# Patient Record
Sex: Male | Born: 1995 | Race: White | Hispanic: No | Marital: Single | Smoking: Never smoker
Health system: Southern US, Community
[De-identification: ages and names within clinical notes are randomized; demographics above are authoritative.]

## PROBLEM LIST (undated history)

## (undated) DIAGNOSIS — J45909 Unspecified asthma, uncomplicated: Secondary | ICD-10-CM

---
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
## 2017-03-29 ENCOUNTER — Emergency Department: Payer: Self-pay

## 2017-03-29 ENCOUNTER — Encounter: Payer: Self-pay | Admitting: Emergency Medicine

## 2017-03-29 ENCOUNTER — Emergency Department
Admission: EM | Admit: 2017-03-29 | Discharge: 2017-03-29 | Disposition: A | Discharge status: Home / Self Care | Payer: Self-pay | Attending: Emergency Medicine | Admitting: Emergency Medicine

## 2017-03-29 DIAGNOSIS — X509XXA Other and unspecified overexertion or strenuous movements or postures, initial encounter: Secondary | ICD-10-CM | POA: Insufficient documentation

## 2017-03-29 DIAGNOSIS — S92154A Nondisplaced avulsion fracture (chip fracture) of right talus, initial encounter for closed fracture: Secondary | ICD-10-CM | POA: Insufficient documentation

## 2017-03-29 DIAGNOSIS — T148XXA Other injury of unspecified body region, initial encounter: Secondary | ICD-10-CM

## 2017-03-29 DIAGNOSIS — S93401A Sprain of unspecified ligament of right ankle, initial encounter: Secondary | ICD-10-CM | POA: Insufficient documentation

## 2017-03-29 DIAGNOSIS — Y929 Unspecified place or not applicable: Secondary | ICD-10-CM | POA: Insufficient documentation

## 2017-03-29 DIAGNOSIS — Y999 Unspecified external cause status: Secondary | ICD-10-CM | POA: Insufficient documentation

## 2017-03-29 DIAGNOSIS — Y9341 Activity, dancing: Secondary | ICD-10-CM | POA: Insufficient documentation

## 2017-03-29 NOTE — ED Provider Notes (Signed)
[facility] Emergency Department Provider Note  ____________________________________________  Time seen: Approximately 12:14 PM  I have reviewed the triage vital signs and the nursing notes.   HISTORY  Chief Complaint Ankle Pain    HPI Greg Nichols is a 21 y.o. male that presents to the emergency department with right ankle pain for one day. Patient states that he was dancing last night when he rolled his right ankle. He states that his shoes are "really soft "so he did not realize that he was on the side of his foot rather than on the bottom. He did not hit the ground because his friend saw him fall and caught him. He did not hit his head or lose consciousness. He continued to walk on his leg after incident. Pain and swelling started on the outside of his ankle. No alleviating measures have been attempted. He denies shortness of breath, chest pain, nausea, vomiting, abdominal pain.   History reviewed. No pertinent past medical history.  There are no active problems to display for this patient.   History reviewed. No pertinent surgical history.  Prior to Admission medications   Not on File    Allergies Codeine  No family history on file.  Social History Social History  Substance Use Topics  . Smoking status: Never Smoker  . Smokeless tobacco: Never Used  . Alcohol use No     Review of Systems  Constitutional: No fever/chills Cardiovascular: No chest pain. Respiratory: No SOB. Gastrointestinal: No abdominal pain.  No nausea, no vomiting.  Musculoskeletal: Positive for ankle pain. Skin: Negative for rash, abrasions, lacerations. Positive for ecchymosis. Neurological: Negative for headaches, numbness or tingling   ____________________________________________   PHYSICAL EXAM:  VITAL SIGNS: ED Triage Vitals  Enc Vitals Group     BP 03/29/17 1151 139/77     Pulse Rate 03/29/17 1151 87     Resp 03/29/17 1151 20     Temp 03/29/17 1151  98.1 F (36.7 C)     Temp src --      SpO2 03/29/17 1151 97 %     Weight 03/29/17 1108 250 lb (113.4 kg)     Height 03/29/17 1108 6' (1.829 m)     Head Circumference --      Peak Flow --      Pain Score --      Pain Loc --      Pain Edu? --      Excl. in GC? --      Constitutional: Alert and oriented. Well appearing and in no acute distress. Eyes: Conjunctivae are normal. PERRL. EOMI. Head: Atraumatic. ENT:      Ears:      Nose: No congestion/rhinnorhea.      Mouth/Throat: Mucous membranes are moist.  Neck: No stridor. Cardiovascular: Normal rate, regular rhythm.  Good peripheral circulation. 2+ dorsalis pedis pulses. Respiratory: Normal respiratory effort without tachypnea or retractions. Lungs CTAB. Good air entry to the bases with no decreased or absent breath sounds. Gastrointestinal: Bowel sounds 4 quadrants. Soft and nontender to palpation. No guarding or rigidity. No palpable masses. No distention.  Musculoskeletal: Full range of motion to all extremities. No gross deformities appreciated.  Swelling and ecchymosis to lateral malleolus. Neurologic:  Normal speech and language. No gross focal neurologic deficits are appreciated.  Skin:  Skin is warm, dry and intact. No rash noted.   ____________________________________________   LABS (all labs ordered are listed, but only abnormal results are displayed)  Labs Reviewed -  No data to display ____________________________________________  EKG   ____________________________________________  RADIOLOGY Lexine BatonI, Raiden Haydu, personally viewed and evaluated these images (plain radiographs) as part of my medical decision making, as well as reviewing the written report by the radiologist.  Dg Ankle Complete Right  Result Date: 03/29/2017 CLINICAL DATA:  Twisted ankle.  Pain swelling . EXAM: RIGHT ANKLE - COMPLETE 3+ VIEW COMPARISON:  No recent prior. FINDINGS: Diffuse soft tissue swelling is present. Tiny bony density noted  arising from the lateral aspect of the talus consistent with tiny avulsion fracture. IMPRESSION: Tiny bony density noted arising from the lateral aspect the talus consistent tiny avulsion fracture. Electronically Signed   By: Maisie Fushomas  Register   On: 03/29/2017 12:45    ____________________________________________    PROCEDURES  Procedure(s) performed:    Procedures    Medications - No data to display   ____________________________________________   INITIAL IMPRESSION / ASSESSMENT AND PLAN / ED COURSE  Pertinent labs & imaging results that were available during my care of the patient were reviewed by me and considered in my medical decision making (see chart for details).  Review of the [place] CSRS was performed in accordance of the [facility] prior to dispensing any controlled drugs.   Patient's diagnosis is consistent with avulsion fracture. Vital signs and exam are reassuring. X-ray consistent with avulsion fracture. Cadillac splint was applied. Crutches were given. Patient does not want anything for pain. Patient is to follow up with orthopedics as directed. Patient is given ED precautions to return to the ED for any worsening or new symptoms.     ____________________________________________  FINAL CLINICAL IMPRESSION(S) / ED DIAGNOSES  Final diagnoses:  Avulsion fracture  Sprain of right ankle, unspecified ligament, initial encounter      NEW MEDICATIONS STARTED DURING THIS VISIT:  There are no discharge medications for this patient.       This chart was dictated using voice recognition software/Dragon. Despite best efforts to proofread, errors can occur which can change the meaning. Any change was purely unintentional.    Enid DerryWagner, Marda Breidenbach, PA-C 03/29/17 1533    Merrily Brittleifenbark, Neil, MD 03/31/17 [id number]

## 2017-03-29 NOTE — ED Triage Notes (Signed)
States he jumped and landed down on right ankle  Swelling noted

## 2018-01-31 ENCOUNTER — Emergency Department
Admission: EM | Admit: 2018-01-31 | Discharge: 2018-02-01 | Disposition: A | Discharge status: Home / Self Care | Payer: Self-pay | Attending: Emergency Medicine | Admitting: Emergency Medicine

## 2018-01-31 ENCOUNTER — Encounter: Payer: Self-pay | Admitting: *Deleted

## 2018-01-31 ENCOUNTER — Other Ambulatory Visit: Payer: Self-pay

## 2018-01-31 DIAGNOSIS — J4551 Severe persistent asthma with (acute) exacerbation: Secondary | ICD-10-CM | POA: Insufficient documentation

## 2018-01-31 DIAGNOSIS — J45909 Unspecified asthma, uncomplicated: Secondary | ICD-10-CM | POA: Insufficient documentation

## 2018-01-31 HISTORY — DX: Unspecified asthma, uncomplicated: J45.909

## 2018-01-31 MED ORDER — PREDNISONE 20 MG PO TABS
60.0000 mg | ORAL_TABLET | ORAL | Status: AC
  Administered 2018-01-31: 60 mg via ORAL
  Filled 2018-01-31: qty 3

## 2018-01-31 MED ORDER — ALBUTEROL SULFATE (2.5 MG/3ML) 0.083% IN NEBU
INHALATION_SOLUTION | RESPIRATORY_TRACT | Status: AC
  Administered 2018-01-31: 5 mg via RESPIRATORY_TRACT
  Filled 2018-01-31: qty 6

## 2018-01-31 MED ORDER — ALBUTEROL SULFATE HFA 108 (90 BASE) MCG/ACT IN AERS: INHALATION_SPRAY | RESPIRATORY_TRACT | 1 refills | Status: DC

## 2018-01-31 MED ORDER — PREDNISONE 10 MG PO TABS: ORAL_TABLET | ORAL | 0 refills | Status: DC

## 2018-01-31 MED ORDER — IPRATROPIUM-ALBUTEROL 0.5-2.5 (3) MG/3ML IN SOLN
RESPIRATORY_TRACT | Status: AC
  Administered 2018-01-31: 3 mL via RESPIRATORY_TRACT
  Filled 2018-01-31: qty 9

## 2018-01-31 MED ORDER — IPRATROPIUM-ALBUTEROL 0.5-2.5 (3) MG/3ML IN SOLN
3.0000 mL | Freq: Once | RESPIRATORY_TRACT | Status: AC
  Administered 2018-01-31: 3 mL via RESPIRATORY_TRACT

## 2018-01-31 MED ORDER — ALBUTEROL SULFATE (2.5 MG/3ML) 0.083% IN NEBU
5.0000 mg | INHALATION_SOLUTION | Freq: Once | RESPIRATORY_TRACT | Status: AC
  Administered 2018-01-31: 5 mg via RESPIRATORY_TRACT

## 2018-01-31 MED ORDER — IPRATROPIUM-ALBUTEROL 0.5-2.5 (3) MG/3ML IN SOLN
3.0000 mL | Freq: Once | RESPIRATORY_TRACT | Status: AC
Start: 2018-01-31 — End: 2018-01-31
  Administered 2018-01-31: 3 mL via RESPIRATORY_TRACT

## 2018-01-31 MED ORDER — ALBUTEROL SULFATE (2.5 MG/3ML) 0.083% IN NEBU
5.0000 mg | INHALATION_SOLUTION | Freq: Once | RESPIRATORY_TRACT | Status: DC
  Filled 2018-01-31: qty 6

## 2018-01-31 NOTE — ED Triage Notes (Signed)
Pt reports onset of shortness of breath this afternoon. Last albuterol neb around 1900. Insp/Exp wheezing throughout. Saturations in triage b/w 85-90% on room air.

## 2018-01-31 NOTE — ED Provider Notes (Signed)
[facility] Emergency Department Provider Note  ____________________________________________   First MD Initiated Contact with Patient 01/31/18 2112     (approximate)  I have reviewed the triage vital signs and the nursing notes.   HISTORY  Chief Complaint Shortness of Breath    HPI Greg Nichols is a 22 y.o. male with a history of asthma who presents for evaluation of shortness of breath.  He states that the shortness of breath has been gradually worsening over the last couple weeks but became severe this afternoon.  He has some albuterol at home but no inhalers, just nebulizers, he says that it helps a little bit but has not been resolving the issue.  He has not been on steroids for a long time.  He admits to smoking.  He denies fever/chills, chest pain, nausea, vomiting, and abdominal pain.  Nothing in particular makes the symptoms better except for a little bit of assistance from the albuterol, and exercise and exertion makes his shortness of breath worse.  He describes it as severe.  Past Medical History:  Diagnosis Date  . Asthma     There are no active problems to display for this patient.   History reviewed. No pertinent surgical history.  Prior to Admission medications   Medication Sig Start Date End Date Taking? Authorizing Provider  albuterol (PROVENTIL HFA;VENTOLIN HFA) 108 (90 Base) MCG/ACT inhaler Inhale 2-4 puffs by mouth every 4 hours as needed for wheezing, cough, and/or shortness of breath 01/31/18   Loleta Rose, MD  predniSONE (DELTASONE) 10 MG tablet Take 6 tabs (60 mg) PO x 3 days, then take 4 tabs (40 mg) PO x 3 days, then take 2 tabs (20 mg) PO x 3 days, then take 1 tab (10 mg) PO x 3 days, then take 1/2 tab (5 mg) PO x 4 days. 01/31/18   Loleta Rose, MD    Allergies Codeine  No family history on file.  Social History Social History   Tobacco Use  . Smoking status: Never Smoker  . Smokeless tobacco: Never Used  Substance  Use Topics  . Alcohol use: No  . Drug use: No    Review of Systems Constitutional: No fever/chills Eyes: No visual changes. ENT: No sore throat. Cardiovascular: Denies chest pain. Respiratory: Severe shortness of breath as described above Gastrointestinal: No abdominal pain.  No nausea, no vomiting.  No diarrhea.  No constipation. Genitourinary: Negative for dysuria. Musculoskeletal: Negative for neck pain.  Negative for back pain. Integumentary: Negative for rash. Neurological: Negative for headaches, focal weakness or numbness.   ____________________________________________   PHYSICAL EXAM:  VITAL SIGNS: ED Triage Vitals  Enc Vitals Group     BP 01/31/18 2102 (!) 127/93     Pulse Rate 01/31/18 2102 (!) 123     Resp 01/31/18 2102 (!) 21     Temp 01/31/18 2102 99.2 F (37.3 C)     Temp Source 01/31/18 2102 Oral     SpO2 01/31/18 2102 (!) 86 %     Weight --      Height --      Head Circumference --      Peak Flow --      Pain Score 01/31/18 2101 0     Pain Loc --      Pain Edu? --      Excl. in GC? --     Constitutional: Alert and oriented.  Generally well-appearing but in mild respiratory distress Eyes: Conjunctivae are normal.  Head: Atraumatic. Nose: No congestion/rhinnorhea. Mouth/Throat: Mucous membranes are moist. Neck: No stridor.  No meningeal signs.   Cardiovascular: Normal rate, regular rhythm. Good peripheral circulation. Grossly normal heart sounds. Respiratory: Increased respiratory effort with some intercostal muscle retractions and wheezing heard throughout lung fields, both inspiratory and expiratory Gastrointestinal: Soft and nontender. No distention.  Musculoskeletal: No lower extremity tenderness nor edema. No gross deformities of extremities. Neurologic:  Normal speech and language. No gross focal neurologic deficits are appreciated.  Skin:  Skin is warm, dry and intact. No rash noted. Psychiatric: Mood and affect are normal. Speech and  behavior are normal.  ____________________________________________   LABS (all labs ordered are listed, but only abnormal results are displayed)  Labs Reviewed - No data to display ____________________________________________  EKG  None - EKG not ordered by ED physician ____________________________________________  RADIOLOGY   ED MD interpretation:  No indication for CXR based on asthma exacerbation  Official radiology report(s): No results found.  ____________________________________________   PROCEDURES  Critical Care performed: No   Procedure(s) performed:   Procedures   ____________________________________________   INITIAL IMPRESSION / ASSESSMENT AND PLAN / ED COURSE  As part of my medical decision making, I reviewed the following data within the electronic MEDICAL RECORD NUMBER Nursing notes reviewed and incorporated    Differential diagnosis includes asthma exacerbation, status asthmaticus, PE, pneumonia.  The patient's symptoms are most consistent with asthma exacerbation.  I will give him 3 duo nebs and prednisone 60 mg and then reassess.  He was hypoxemic down to about 86% when he arrived, but he is 98% at rest getting a breathing treatment.  I will reassess carefully.  Clinical Course as of May 07 0001  Mon Jan 31, 2018  2310 While resting in bed with the pulse oximeter on his finger, the patient has an oxygen saturation the varies from 86% although up to 98% depending on whether he is breathing through his mouth.  He says he feels "1000% better" than when he arrived although he remains hypoxemic even at rest.  His lung sounds are better than before but he still has some expiratory wheezing throughout.  He states he is ready to go but I explained I am a little bit nervous based on the degree of hypoxemia.  He still also has tachycardia in the 120s but he did just finish at least 4 albuterol treatments.We agreed that he would ambulate with a pulse oximeter and  see how low his oxygen level drops.  He may require admission for frequent or continuous albuterol and oxygen therapy but will determine that after he sees how he feels with some ambulation.   [CF]  2358 Patient is now satting in the mid to upper 90s.  Even though he was low when he was ambulating he says he feels fine wants to go home.  I think it is appropriate and I gave him strict return precautions.  He agrees with the plan.   [CF]    Clinical Course User Index [CF] Loleta Rose, MD    ____________________________________________  FINAL CLINICAL IMPRESSION(S) / ED DIAGNOSES  Final diagnoses:  Severe persistent asthma with exacerbation     MEDICATIONS GIVEN DURING THIS VISIT:  Medications  ipratropium-albuterol (DUONEB) 0.5-2.5 (3) MG/3ML nebulizer solution 3 mL (3 mLs Nebulization Given 01/31/18 2123)  ipratropium-albuterol (DUONEB) 0.5-2.5 (3) MG/3ML nebulizer solution 3 mL (3 mLs Nebulization Given 01/31/18 2122)  ipratropium-albuterol (DUONEB) 0.5-2.5 (3) MG/3ML nebulizer solution 3 mL (3 mLs Nebulization Given 01/31/18 2122)  predniSONE (DELTASONE) tablet 60 mg (60 mg Oral Given 01/31/18 2203)  albuterol (PROVENTIL) (2.5 MG/3ML) 0.083% nebulizer solution 5 mg (5 mg Nebulization Given 01/31/18 2203)     ED Discharge Orders        Ordered    predniSONE (DELTASONE) 10 MG tablet     01/31/18 2359    albuterol (PROVENTIL HFA;VENTOLIN HFA) 108 (90 Base) MCG/ACT inhaler     01/31/18 2359       Note:  This document was prepared using Dragon voice recognition software and may include unintentional dictation errors.    Loleta Rose, MD 02/01/18 0001

## 2018-01-31 NOTE — ED Notes (Signed)
Pt up to ambulate with O2 monitoring. Results are: O2 range between 96-98% on room air. Pt denies SOB.

## 2018-02-01 NOTE — Discharge Instructions (Signed)
We believe that your symptoms are caused today by an exacerbation of your asthma.  We offered to admit you based on your low oxygen level, but since you are feeling so much better, we agree it is appropriate for you to go home.  However, if you develop any new or worsening symptoms, including but not limited to fever, persistent vomiting, worsening shortness of breath, or other symptoms that concern you, please return to the Emergency Department immediately.  Please take the prescribed medications and any medications that you have at home.  Follow up with your doctor as recommended.

## 2018-09-25 ENCOUNTER — Emergency Department: Payer: Self-pay

## 2018-09-25 ENCOUNTER — Encounter: Payer: Self-pay | Admitting: Emergency Medicine

## 2018-09-25 ENCOUNTER — Emergency Department
Admission: EM | Admit: 2018-09-25 | Discharge: 2018-09-25 | Disposition: A | Discharge status: Home / Self Care | Payer: Self-pay | Attending: Emergency Medicine | Admitting: Emergency Medicine

## 2018-09-25 DIAGNOSIS — Z79899 Other long term (current) drug therapy: Secondary | ICD-10-CM | POA: Insufficient documentation

## 2018-09-25 DIAGNOSIS — J45901 Unspecified asthma with (acute) exacerbation: Secondary | ICD-10-CM | POA: Insufficient documentation

## 2018-09-25 LAB — BASIC METABOLIC PANEL
ANION GAP: 12 (ref 5–15)
BUN: 5 mg/dL — AB (ref 6–20)
CALCIUM: 9.8 mg/dL (ref 8.9–10.3)
CO2: 24 mmol/L (ref 22–32)
Chloride: 102 mmol/L (ref 98–111)
Creatinine, Ser: 0.77 mg/dL (ref 0.61–1.24)
GFR calc Af Amer: >60 mL/min (ref >60)
GLUCOSE: 101 mg/dL — AB (ref 70–99)
Potassium: 3.8 mmol/L (ref 3.5–5.1)
Sodium: 138 mmol/L (ref 135–145)

## 2018-09-25 LAB — CBC
HCT: 52.3 % — ABNORMAL HIGH (ref 39.0–52.0)
Hemoglobin: 17.3 g/dL — ABNORMAL HIGH (ref 13.0–17.0)
MCH: 28.8 pg (ref 26.0–34.0)
MCHC: 33.1 g/dL (ref 30.0–36.0)
MCV: 87.2 fL (ref 80.0–100.0)
PLATELETS: 202 10*3/uL (ref 150–400)
RBC: 6 MIL/uL — AB (ref 4.22–5.81)
RDW: 13.9 % (ref 11.5–15.5)
WBC: 11.2 10*3/uL — ABNORMAL HIGH (ref 4.0–10.5)
nRBC: 0 % (ref 0.0–0.2)

## 2018-09-25 LAB — TROPONIN I

## 2018-09-25 MED ORDER — ALBUTEROL SULFATE (2.5 MG/3ML) 0.083% IN NEBU
5.0000 mg | INHALATION_SOLUTION | Freq: Once | RESPIRATORY_TRACT | Status: AC
  Administered 2018-09-25: 5 mg via RESPIRATORY_TRACT
  Filled 2018-09-25: qty 6

## 2018-09-25 MED ORDER — IPRATROPIUM-ALBUTEROL 0.5-2.5 (3) MG/3ML IN SOLN
3.0000 mL | Freq: Once | RESPIRATORY_TRACT | Status: AC
  Administered 2018-09-25: 3 mL via RESPIRATORY_TRACT
  Filled 2018-09-25: qty 3

## 2018-09-25 MED ORDER — METHYLPREDNISOLONE SODIUM SUCC 125 MG IJ SOLR
125.0000 mg | Freq: Once | INTRAMUSCULAR | Status: AC
  Administered 2018-09-25: 125 mg via INTRAVENOUS
  Filled 2018-09-25: qty 2

## 2018-09-25 MED ORDER — PREDNISONE 20 MG PO TABS: 40.0000 mg | ORAL_TABLET | Freq: Every day | ORAL | 0 refills | Status: DC

## 2018-09-25 MED ORDER — ALBUTEROL SULFATE HFA 108 (90 BASE) MCG/ACT IN AERS: 2.0000 | INHALATION_SPRAY | RESPIRATORY_TRACT | 0 refills | Status: DC | PRN

## 2018-09-25 NOTE — ED Triage Notes (Signed)
Patient presents to the ED with shortness of breath and congestion x 1.5 days.  Patient states prior to this he had a cough but cough has resolved.  Patient reports history of asthma but reports inhaler is not helping his shortness of breath.  Patient is tripoding in triage and very tachypnic.  Inspiratory and expiratory wheezes heard in all four quadrants of his lungs.  Patient is having difficulty speaking in full sentences.

## 2018-09-25 NOTE — Discharge Instructions (Addendum)
Please seek medical attention for any high fevers, chest pain, shortness of breath, change in behavior, persistent vomiting, bloody stool or any other new or concerning symptoms.  

## 2018-09-25 NOTE — ED Notes (Signed)
Patient is in less distress post breathing treatment.  Able to speak in full sentences and laugh occasionally.

## 2018-09-25 NOTE — ED Provider Notes (Signed)
[facility] Emergency Department Provider Note  ____________________________________________   I have reviewed the triage vital signs and the nursing notes.   HISTORY  Chief Complaint Shortness of Breath   History limited by: Not Limited   HPI Greg Nichols is a 22 y.o. male who presents to the emergency department today because of concerns for breathing difficulty.  The patient states that he has had a tough time breathing for the past day and a half.  He has tried his inhaler at home without any significant relief.  Additionally has used a neighbor's nebulizer machine without any significant improvement.  The patient does have a history of asthma.  He denies any chest pain.  Did have some cough.  Denies any fevers.  Denies any known sick contacts.   Per medical record review patient has a history of astham  Past Medical History:  Diagnosis Date  . Asthma     There are no active problems to display for this patient.   History reviewed. No pertinent surgical history.  Prior to Admission medications   Medication Sig Start Date End Date Taking? Authorizing Provider  albuterol (PROVENTIL HFA;VENTOLIN HFA) 108 (90 Base) MCG/ACT inhaler Inhale 2-4 puffs by mouth every 4 hours as needed for wheezing, cough, and/or shortness of breath 01/31/18   Loleta RoseForbach, Cory, MD  predniSONE (DELTASONE) 10 MG tablet Take 6 tabs (60 mg) PO x 3 days, then take 4 tabs (40 mg) PO x 3 days, then take 2 tabs (20 mg) PO x 3 days, then take 1 tab (10 mg) PO x 3 days, then take 1/2 tab (5 mg) PO x 4 days. 01/31/18   Loleta RoseForbach, Cory, MD    Allergies Codeine  No family history on file.  Social History Social History   Tobacco Use  . Smoking status: Never Smoker  . Smokeless tobacco: Never Used  Substance Use Topics  . Alcohol use: No  . Drug use: No    Review of Systems Constitutional: No fever/chills Eyes: No visual changes. ENT: No sore throat. Cardiovascular: Denies chest  pain. Respiratory: Positive for shortness of breath. Gastrointestinal: No abdominal pain.  No nausea, no vomiting.  No diarrhea.   Genitourinary: Negative for dysuria. Musculoskeletal: Negative for back pain. Skin: Negative for rash. Neurological: Negative for headaches, focal weakness or numbness.  ____________________________________________   PHYSICAL EXAM:  VITAL SIGNS: ED Triage Vitals  Enc Vitals Group     BP 09/25/18 1429 (!) 128/117     Pulse Rate 09/25/18 1429 (!) 126     Resp 09/25/18 1429 (!) 36     Temp 09/25/18 1429 99.6 F (37.6 C)     Temp Source 09/25/18 1429 Oral     SpO2 09/25/18 1429 91 %     Weight 09/25/18 1435 215 lb (97.5 kg)     Height 09/25/18 1435 5\' 11"  (1.803 m)     Head Circumference --      Peak Flow --      Pain Score 09/25/18 1430 0   Constitutional: Alert and oriented.  Eyes: Conjunctivae are normal.  ENT      Head: Normocephalic and atraumatic.      Nose: No congestion/rhinnorhea.      Mouth/Throat: Mucous membranes are moist.      Neck: No stridor. Hematological/Lymphatic/Immunilogical: No cervical lymphadenopathy. Cardiovascular: Tachycardic, regular rhythm.  No murmurs, rubs, or gallops.  Respiratory: Tachypneic.  Diffuse expiratory wheezing. Gastrointestinal: Soft and non tender. No rebound. No guarding.  Genitourinary: Deferred  Musculoskeletal: Normal range of motion in all extremities. No lower extremity edema. Neurologic:  Normal speech and language. No gross focal neurologic deficits are appreciated.  Skin:  Skin is warm, dry and intact. No rash noted. Psychiatric: Mood and affect are normal. Speech and behavior are normal. Patient exhibits appropriate insight and judgment.  ____________________________________________    LABS (pertinent positives/negatives)  Trop <0.03 BMP wnl except glu 101, bun 5 CBC wbc 11.2, hgb 17.3, plt 202  ____________________________________________   EKG  I, Greg SemenGraydon Amel Kitch, attending  physician, personally viewed and interpreted this EKG  EKG Time: 1445 Rate: 127 Rhythm: sinus tachycardia Axis: normal Intervals: qtc 427 QRS: narrow ST changes: no st elevation Impression: abnormal ekg  ____________________________________________    RADIOLOGY  CXR Normal exam  ____________________________________________   PROCEDURES  Procedures  ____________________________________________   INITIAL IMPRESSION / ASSESSMENT AND PLAN / ED COURSE  Pertinent labs & imaging results that were available during my care of the patient were reviewed by me and considered in my medical decision making (see chart for details).   Patient presented to the emergency department today because of concerns for shortness of breath for the past day and a half.  Differential acute asthma exacerbation, pneumonia, influenza, PE, ACS, pneumothorax amongst other etiologies.  Patient did have diffuse expiratory wheezing on exam.  Chest x-ray without any concerning findings.  He did feel better after breathing treatments.  At this point I think asthma exacerbation likely.  Discussed with patient that we will be prescribing prednisone for the next few days.  Discussed importance of continuing to use his inhaler.  ____________________________________________   FINAL CLINICAL IMPRESSION(S) / ED DIAGNOSES  Final diagnoses:  Exacerbation of asthma, unspecified asthma severity, unspecified whether persistent     Note: This dictation was prepared with Dragon dictation. Any transcriptional errors that result from this process are unintentional     Greg Nichols, Greg Reeder, MD 09/25/18 [phone]

## 2019-12-05 ENCOUNTER — Emergency Department
Admission: EM | Admit: 2019-12-05 | Discharge: 2019-12-05 | Disposition: A | Discharge status: Home / Self Care | Payer: Self-pay | Attending: Student in an Organized Health Care Education/Training Program | Admitting: Student in an Organized Health Care Education/Training Program

## 2019-12-05 ENCOUNTER — Other Ambulatory Visit: Payer: Self-pay

## 2019-12-05 ENCOUNTER — Encounter: Payer: Self-pay | Admitting: Emergency Medicine

## 2019-12-05 DIAGNOSIS — Y929 Unspecified place or not applicable: Secondary | ICD-10-CM | POA: Insufficient documentation

## 2019-12-05 DIAGNOSIS — F419 Anxiety disorder, unspecified: Secondary | ICD-10-CM | POA: Insufficient documentation

## 2019-12-05 DIAGNOSIS — J45909 Unspecified asthma, uncomplicated: Secondary | ICD-10-CM | POA: Insufficient documentation

## 2019-12-05 DIAGNOSIS — Y998 Other external cause status: Secondary | ICD-10-CM | POA: Insufficient documentation

## 2019-12-05 DIAGNOSIS — X509XXA Other and unspecified overexertion or strenuous movements or postures, initial encounter: Secondary | ICD-10-CM | POA: Insufficient documentation

## 2019-12-05 DIAGNOSIS — Y93B2 Activity, push-ups, pull-ups, sit-ups: Secondary | ICD-10-CM | POA: Insufficient documentation

## 2019-12-05 DIAGNOSIS — T466X5A Adverse effect of antihyperlipidemic and antiarteriosclerotic drugs, initial encounter: Secondary | ICD-10-CM

## 2019-12-05 DIAGNOSIS — S46912A Strain of unspecified muscle, fascia and tendon at shoulder and upper arm level, left arm, initial encounter: Secondary | ICD-10-CM | POA: Insufficient documentation

## 2019-12-05 MED ORDER — CYCLOBENZAPRINE HCL 10 MG PO TABS: 10.0000 mg | ORAL_TABLET | Freq: Three times a day (TID) | ORAL | 0 refills | Status: AC | PRN

## 2019-12-05 MED ORDER — NAPROXEN 500 MG PO TABS: 500.0000 mg | ORAL_TABLET | Freq: Two times a day (BID) | ORAL | Status: AC

## 2019-12-05 NOTE — ED Provider Notes (Signed)
[facility] Emergency Department Provider Note   ____________________________________________   First MD Initiated Contact with Patient 12/05/19 1355     (approximate)  I have reviewed the triage vital signs and the nursing notes.   HISTORY  Chief Complaint Arm Pain    HPI Greg Nichols is a 24 y.o. male patient presents with intermittent pain to the left upper arm for 2 weeks.  Patient describes discomfort as a muscle soreness/tightness.  Patient states discomfort started 2 weeks ago when he was doing a lot of push-ups but he has decreased them secondary to the soreness.  Patient relates a history of anxiety since increase whenever he has pain in the left upper extremity since he believes is related to heart condition.  Patient state this belief is secondary to Internet search.  Patient denies chest pain or dyspnea.  Patient stated episodes of increased heartbeat and a tingling sensation to the lips and fingertips.      Past Medical History:  Diagnosis Date  . Asthma     There are no problems to display for this patient.   History reviewed. No pertinent surgical history.  Prior to Admission medications   Medication Sig Start Date End Date Taking? Authorizing Provider  albuterol (PROVENTIL HFA;VENTOLIN HFA) 108 (90 Base) MCG/ACT inhaler Inhale 2-4 puffs by mouth every 4 hours as needed for wheezing, cough, and/or shortness of breath 01/31/18   Loleta Rose, MD  cyclobenzaprine (FLEXERIL) 10 MG tablet Take 1 tablet (10 mg total) by mouth 3 (three) times daily as needed. 12/05/19   Joni Reining, PA-C  naproxen (NAPROSYN) 500 MG tablet Take 1 tablet (500 mg total) by mouth 2 (two) times daily with a meal. 12/05/19   Joni Reining, PA-C    Allergies Codeine  No family history on file.  Social History Social History   Tobacco Use  . Smoking status: Never Smoker  . Smokeless tobacco: Never Used  Substance Use Topics  . Alcohol use: No  . Drug  use: Yes    Types: Marijuana    Review of Systems  Constitutional: No fever/chills Eyes: No visual changes. ENT: No sore throat. Cardiovascular: Denies chest pain. Respiratory: Denies shortness of breath. Gastrointestinal: No abdominal pain.  No nausea, no vomiting.  No diarrhea.  No constipation. Genitourinary: Negative for dysuria. Musculoskeletal: Left upper extremity pain Skin: Negative for rash. Neurological: Negative for headaches, focal weakness or numbness. Psychiatric:  Anxiety Allergic/Immunilogical: Codeine  ____________________________________________   PHYSICAL EXAM:  VITAL SIGNS: ED Triage Vitals [12/05/19 1350]  Enc Vitals Group     BP      Pulse      Resp      Temp      Temp src      SpO2      Weight 200 lb (90.7 kg)     Height 5\' 11"  (1.803 m)     Head Circumference      Peak Flow      Pain Score 0     Pain Loc      Pain Edu?      Excl. in GC?     Constitutional: Alert and oriented. Well appearing and in no acute distress. Cardiovascular: Normal rate, regular rhythm. Grossly normal heart sounds.  Good peripheral circulation. Respiratory: Normal respiratory effort.  No retractions. Lungs CTAB. Gastrointestinal: Soft and nontender. No distention. No abdominal bruits. No CVA tenderness. Musculoskeletal: No lower extremity tenderness nor edema.  No joint effusions. Neurologic:  Normal speech and language. No gross focal neurologic deficits are appreciated. No gait instability. Skin:  Skin is warm, dry and intact. No rash noted. Psychiatric: Mood and affect are normal. Speech and behavior are normal.  ____________________________________________   LABS (all labs ordered are listed, but only abnormal results are displayed)  Labs Reviewed - No data to display ____________________________________________  EKG  Read by heart station Dr. with no acute findings. ____________________________________________  [place]  ED MD interpretation:     Official radiology report(s): No results found.  ____________________________________________   PROCEDURES  Procedure(s) performed (including Critical Care):  Procedures   ____________________________________________   INITIAL IMPRESSION / ASSESSMENT AND PLAN / ED COURSE  As part of my medical decision making, I reviewed the following data within the [facility]     Patient presents with 2 weeks of left upper extremity pain.  Patient states his anxiety increases discomfort.  Discussed and showed EKG tracing the patient.  Physical exam is consistent with myalgia.  Patient given discharge care instruction advised to [facility].   Greg Nichols was evaluated in Emergency Department on 12/05/2019 for the symptoms described in the history of present illness. He was evaluated in the context of the global COVID-19 pandemic, which necessitated consideration that the patient might be at risk for infection with the SARS-CoV-2 virus that causes COVID-19. Institutional protocols and algorithms that pertain to the evaluation of patients at risk for COVID-19 are in a state of rapid change based on information released by regulatory bodies including the CDC and federal and state organizations. These policies and algorithms were followed during the patient's care in the ED.       ____________________________________________   FINAL CLINICAL IMPRESSION(S) / ED DIAGNOSES  Final diagnoses:  Myalgia due to statin  Anxiety     ED Discharge Orders         Ordered    naproxen (NAPROSYN) 500 MG tablet  2 times daily with meals     12/05/19 1441    cyclobenzaprine (FLEXERIL) 10 MG tablet  3 times daily PRN     12/05/19 1441           Note:  This document was prepared using Dragon voice recognition software and may include unintentional dictation errors.    Sable Feil, PA-C 12/05/19 1449    Merlyn Lot, MD 12/05/19 [phone]

## 2019-12-05 NOTE — ED Triage Notes (Signed)
Presents with intermittent discomfort to left upper arm for the past 2 weeks  Describes discomfort as muscle soreness/tightness "like when you over exercise"  Good pulses and good grips

## 2019-12-06 ENCOUNTER — Emergency Department
Admission: EM | Admit: 2019-12-06 | Discharge: 2019-12-06 | Disposition: A | Discharge status: Home / Self Care | Payer: Self-pay | Attending: Emergency Medicine | Admitting: Emergency Medicine

## 2019-12-06 ENCOUNTER — Encounter: Payer: Self-pay | Admitting: Emergency Medicine

## 2019-12-06 ENCOUNTER — Emergency Department: Payer: Self-pay

## 2019-12-06 ENCOUNTER — Other Ambulatory Visit: Payer: Self-pay

## 2019-12-06 DIAGNOSIS — R0789 Other chest pain: Secondary | ICD-10-CM | POA: Insufficient documentation

## 2019-12-06 DIAGNOSIS — J45909 Unspecified asthma, uncomplicated: Secondary | ICD-10-CM | POA: Insufficient documentation

## 2019-12-06 LAB — CBC
HCT: 45.8 % (ref 39.0–52.0)
Hemoglobin: 15 g/dL (ref 13.0–17.0)
MCH: 28.7 pg (ref 26.0–34.0)
MCHC: 32.8 g/dL (ref 30.0–36.0)
MCV: 87.7 fL (ref 80.0–100.0)
Platelets: 199 10*3/uL (ref 150–400)
RBC: 5.22 MIL/uL (ref 4.22–5.81)
RDW: 13.3 % (ref 11.5–15.5)
WBC: 7.5 10*3/uL (ref 4.0–10.5)
nRBC: 0 % (ref 0.0–0.2)

## 2019-12-06 LAB — BASIC METABOLIC PANEL
Anion gap: 10 (ref 5–15)
BUN: 13 mg/dL (ref 6–20)
CO2: 27 mmol/L (ref 22–32)
Calcium: 9.5 mg/dL (ref 8.9–10.3)
Chloride: 103 mmol/L (ref 98–111)
Creatinine, Ser: 0.9 mg/dL (ref 0.61–1.24)
GFR calc Af Amer: >60 mL/min (ref >60)
GFR calc non Af Amer: >60 mL/min (ref >60)
Glucose, Bld: 109 mg/dL — ABNORMAL HIGH (ref 70–99)
Potassium: 3.9 mmol/L (ref 3.5–5.1)
Sodium: 140 mmol/L (ref 135–145)

## 2019-12-06 LAB — TROPONIN I (HIGH SENSITIVITY): Troponin I (High Sensitivity): <2 ng/L (ref <18)

## 2019-12-06 MED ORDER — PREDNISONE 50 MG PO TABS: 50.0000 mg | ORAL_TABLET | Freq: Every day | ORAL | 0 refills | Status: DC

## 2019-12-06 MED ORDER — PREDNISONE 20 MG PO TABS
50.0000 mg | ORAL_TABLET | Freq: Once | ORAL | Status: AC
  Administered 2019-12-06: 22:00:00 50 mg via ORAL
  Filled 2019-12-06: qty 3

## 2019-12-06 NOTE — ED Triage Notes (Signed)
Pt in via POV, reports ongoing left side chest pain for a few days, recently seen here for same, exam unremarkable.  Denies and further follow up.  Ambulatory to triage, NAD noted at this time.

## 2019-12-06 NOTE — ED Provider Notes (Signed)
[facility] Emergency Department Provider Note   ____________________________________________    I have reviewed the triage vital signs and the nursing notes.   HISTORY  Chief Complaint Chest Pain     HPI Greg Nichols is a 24 y.o. male with a history of asthma who presents with complaints of mild chest tightness in his left upper chest.  Patient reports when he takes a very deep breath he feels that it is slightly tight at the end of inhalation.  Denies fevers chills or cough.  Seen recently emergency department at that time had more musculoskeletal arm pain, and reports that there is resolved.  Has been taking naproxen and muscle relaxers.  Denies shortness of breath.    Past Medical History:  Diagnosis Date  . Asthma     There are no problems to display for this patient.   History reviewed. No pertinent surgical history.  Prior to Admission medications   Medication Sig Start Date End Date Taking? Authorizing Provider  albuterol (PROVENTIL HFA;VENTOLIN HFA) 108 (90 Base) MCG/ACT inhaler Inhale 2-4 puffs by mouth every 4 hours as needed for wheezing, cough, and/or shortness of breath 01/31/18   Loleta Rose, MD  cyclobenzaprine (FLEXERIL) 10 MG tablet Take 1 tablet (10 mg total) by mouth 3 (three) times daily as needed. 12/05/19   Joni Reining, PA-C  naproxen (NAPROSYN) 500 MG tablet Take 1 tablet (500 mg total) by mouth 2 (two) times daily with a meal. 12/05/19   Joni Reining, PA-C  predniSONE (DELTASONE) 50 MG tablet Take 1 tablet (50 mg total) by mouth daily with breakfast. 12/06/19   Jene Every, MD     Allergies Codeine  No family history on file.  Social History Social History   Tobacco Use  . Smoking status: Never Smoker  . Smokeless tobacco: Never Used  Substance Use Topics  . Alcohol use: No  . Drug use: Not Currently    Types: Marijuana    Review of Systems  Constitutional: No fever/chills Eyes: No visual changes.   ENT: No sore throat. Cardiovascular: As above Respiratory: As above Gastrointestinal: No abdominal pain.  No nausea, no vomiting.   Genitourinary: Negative for dysuria. Musculoskeletal: Negative for back pain. Skin: Negative for rash. Neurological: Negative for headaches or weakness   ____________________________________________   PHYSICAL EXAM:  VITAL SIGNS: ED Triage Vitals  Enc Vitals Group     BP 12/06/19 2038 (!) 142/81     Pulse Rate 12/06/19 2038 91     Resp 12/06/19 2038 16     Temp 12/06/19 2038 98.1 F (36.7 C)     Temp Source 12/06/19 2038 Oral     SpO2 12/06/19 2038 98 %     Weight 12/06/19 2039 95.3 kg (210 lb)     Height 12/06/19 2039 1.803 m (5\' 11" )     Head Circumference --      Peak Flow --      Pain Score 12/06/19 2038 3     Pain Loc --      Pain Edu? --      Excl. in GC? --     Constitutional: Alert and oriented  Nose: No congestion/rhinnorhea. Mouth/Throat: Mucous membranes are moist.    Cardiovascular: Normal rate, regular rhythm. Grossly normal heart sounds.  Good peripheral circulation. Respiratory: Normal respiratory effort.  No retractions.  Mild scattered wheezes in the left  Musculoskeletal: No lower extremity tenderness nor edema.  Warm and well perfused Neurologic:  Normal speech and language. No gross focal neurologic deficits are appreciated.  Skin:  Skin is warm, dry and intact. No rash noted. Psychiatric: Mood and affect are normal. Speech and behavior are normal.  ____________________________________________   LABS (all labs ordered are listed, but only abnormal results are displayed)  Labs Reviewed  BASIC METABOLIC PANEL - Abnormal; Notable for the following components:      Result Value   Glucose, Bld 109 (*)    All other components within normal limits  CBC  TROPONIN I (HIGH SENSITIVITY)  TROPONIN I (HIGH SENSITIVITY)   ____________________________________________  EKG  ED ECG REPORT I, Lavonia Drafts, the  attending physician, personally viewed and interpreted this ECG.  Date: 12/06/2019  Rhythm: normal sinus rhythm QRS Axis: normal Intervals: normal ST/T Wave abnormalities: normal Narrative Interpretation: no evidence of acute ischemia  ____________________________________________  RADIOLOGY  Chest x-ray viewed by me, clear, no pneumothorax ____________________________________________   PROCEDURES  Procedure(s) performed: No  Procedures   Critical Care performed: No ____________________________________________   INITIAL IMPRESSION / ASSESSMENT AND PLAN / ED COURSE  Pertinent labs & imaging results that were available during my care of the patient were reviewed by me and considered in my medical decision making (see chart for details).  Patient well-appearing and in no acute distress.  Exam is quite reassuring, possible mild scattered wheezes on the left, suspect bronchospasm as the cause of his chest tightness.  Encouraged use of albuterol inhaler, will prescribe prednisone.  EKG is normal, troponin is normal, HPI not consistent with ACS, PE, dissection, myocarditis   ____________________________________________   FINAL CLINICAL IMPRESSION(S) / ED DIAGNOSES  Final diagnoses:  Atypical chest pain        Note:  This document was prepared using Dragon voice recognition software and may include unintentional dictation errors.   Lavonia Drafts, MD 12/06/19 2227

## 2020-04-09 ENCOUNTER — Telehealth: Payer: Self-pay | Admitting: General Practice

## 2020-04-09 NOTE — Telephone Encounter (Signed)
Individual has been contacted 3+ times regarding ED referral. No further attempts to contact individual will be made. 

## 2021-06-23 ENCOUNTER — Other Ambulatory Visit: Payer: Self-pay

## 2021-06-23 DIAGNOSIS — J45909 Unspecified asthma, uncomplicated: Secondary | ICD-10-CM | POA: Insufficient documentation

## 2021-06-23 DIAGNOSIS — K625 Hemorrhage of anus and rectum: Secondary | ICD-10-CM | POA: Insufficient documentation

## 2021-06-23 LAB — CBC WITH DIFFERENTIAL/PLATELET
Abs Immature Granulocytes: 0.02 10*3/uL (ref 0.00–0.07)
Basophils Absolute: 0.1 10*3/uL (ref 0.0–0.1)
Basophils Relative: 1 %
Eosinophils Absolute: 0.8 10*3/uL — ABNORMAL HIGH (ref 0.0–0.5)
Eosinophils Relative: 10 %
HCT: 45.2 % (ref 39.0–52.0)
Hemoglobin: 15.3 g/dL (ref 13.0–17.0)
Immature Granulocytes: 0 %
Lymphocytes Relative: 29 %
Lymphs Abs: 2.3 10*3/uL (ref 0.7–4.0)
MCH: 29.3 pg (ref 26.0–34.0)
MCHC: 33.8 g/dL (ref 30.0–36.0)
MCV: 86.6 fL (ref 80.0–100.0)
Monocytes Absolute: 0.6 10*3/uL (ref 0.1–1.0)
Monocytes Relative: 7 %
Neutro Abs: 4.3 10*3/uL (ref 1.7–7.7)
Neutrophils Relative %: 53 %
Platelets: 251 10*3/uL (ref 150–400)
RBC: 5.22 MIL/uL (ref 4.22–5.81)
RDW: 13.9 % (ref 11.5–15.5)
WBC: 8.1 10*3/uL (ref 4.0–10.5)
nRBC: 0 % (ref 0.0–0.2)

## 2021-06-23 LAB — BASIC METABOLIC PANEL
Anion gap: 5 (ref 5–15)
BUN: 11 mg/dL (ref 6–20)
CO2: 32 mmol/L (ref 22–32)
Calcium: 9.5 mg/dL (ref 8.9–10.3)
Chloride: 102 mmol/L (ref 98–111)
Creatinine, Ser: 0.94 mg/dL (ref 0.61–1.24)
GFR, Estimated: >60 mL/min (ref >60)
Glucose, Bld: 96 mg/dL (ref 70–99)
Potassium: 4.4 mmol/L (ref 3.5–5.1)
Sodium: 139 mmol/L (ref 135–145)

## 2021-06-23 LAB — TYPE AND SCREEN
ABO/RH(D): A POS
Antibody Screen: NEGATIVE

## 2021-06-23 NOTE — ED Triage Notes (Signed)
Pt comes with c/o rectal bleeding that started today. Pt states little belly pain. Pt states it was bright red blood.

## 2021-06-23 NOTE — ED Provider Notes (Signed)
Emergency Medicine Provider Triage Evaluation Note  Greg Nichols, a 25 y.o. male  was evaluated in triage.  Pt complains of single episode of BRBPR. He had some BRB in the toilet today. He denies FCS, NV, diarrhea, or constipation. No history of colitis, gastritis, or hemorrhoids.   Review of Systems  Positive: BRBPR Negative: NVD  Physical Exam  There were no vitals taken for this visit. Gen:   Awake, no distress  NAD Resp:  Normal effort CTA MSK:   Moves extremities without difficulty  Other:  ABD: soft, nontender  Medical Decision Making  Medically screening exam initiated at 5:20 PM.  Appropriate orders placed.  Greg Nichols was informed that the remainder of the evaluation will be completed by another provider, this initial triage assessment does not replace that evaluation, and the importance of remaining in the ED until their evaluation is complete.  Patient with an episode of BRBPR.    Lissa Hoard, PA-C 06/23/21 1726    Merwyn Katos, MD 06/23/21 2128

## 2021-06-24 ENCOUNTER — Emergency Department: Payer: Self-pay

## 2021-06-24 ENCOUNTER — Emergency Department
Admission: EM | Admit: 2021-06-24 | Discharge: 2021-06-24 | Disposition: A | Discharge status: Home / Self Care | Payer: Self-pay | Attending: Emergency Medicine | Admitting: Emergency Medicine

## 2021-06-24 DIAGNOSIS — K625 Hemorrhage of anus and rectum: Secondary | ICD-10-CM

## 2021-06-24 LAB — HEMOGLOBIN AND HEMATOCRIT, BLOOD
HCT: 44.1 % (ref 39.0–52.0)
Hemoglobin: 14.7 g/dL (ref 13.0–17.0)

## 2021-06-24 MED ORDER — IOHEXOL 350 MG/ML SOLN
100.0000 mL | Freq: Once | INTRAVENOUS | Status: AC | PRN
  Administered 2021-06-24: 100 mL via INTRAVENOUS
  Filled 2021-06-24: qty 100

## 2021-06-24 NOTE — ED Notes (Signed)
Patient discharged to home per MD order. Patient in stable condition, and deemed medically cleared by ED provider for discharge. Discharge instructions reviewed with patient/family using "Teach Back"; verbalized understanding of medication education and administration, and information about follow-up care. Denies further concerns. ° °

## 2021-06-24 NOTE — Discharge Instructions (Addendum)
As we discussed, if you notice any more blood in your stool please return to the emergency room immediately for further evaluation.  Otherwise follow-up with your primary care doctor.  Increase fiber in your diet.

## 2021-06-24 NOTE — ED Provider Notes (Signed)
[facility] Emergency Department Provider Note  ____________________________________________  Time seen: Approximately 1:01 AM  I have reviewed the triage vital signs and the nursing notes.   HISTORY  Chief Complaint Rectal Bleeding   HPI Greg Nichols is a 25 y.o. male with a history of asthma who presents for evaluation of bright red blood per rectum.  Patient reports that he had a bowel movement 9 hours ago at home and noticed blood in the water in the toilet and in the stool.  He denies melena.  He does report some burning in his rectum while he was pooping but that has resolved.  Patient reports that he frequently has that because he eats a lot of spicy food.  He denies any abdominal pain, no nausea or vomiting, no prior history of rectal bleed, no history of cirrhosis, no history of alcoholism.  Patient does not take any blood thinners.  No family history of colon cancer.  Patient denies any further bowel movement since then.  Denies NSAID use.  Past Medical History:  Diagnosis Date   Asthma     Prior to Admission medications   Medication Sig Start Date End Date Taking? Authorizing Provider  albuterol (PROVENTIL HFA;VENTOLIN HFA) 108 (90 Base) MCG/ACT inhaler Inhale 2-4 puffs by mouth every 4 hours as needed for wheezing, cough, and/or shortness of breath 01/31/18   Loleta Rose, MD  cyclobenzaprine (FLEXERIL) 10 MG tablet Take 1 tablet (10 mg total) by mouth 3 (three) times daily as needed. 12/05/19   Joni Reining, PA-C  naproxen (NAPROSYN) 500 MG tablet Take 1 tablet (500 mg total) by mouth 2 (two) times daily with a meal. 12/05/19   Joni Reining, PA-C  predniSONE (DELTASONE) 50 MG tablet Take 1 tablet (50 mg total) by mouth daily with breakfast. 12/06/19   Jene Every, MD    Allergies Codeine  No family history on file.  Social History Social History   Tobacco Use   Smoking status: Never   Smokeless tobacco: Never  Vaping Use   Vaping  Use: Some days   Substances: Nicotine  Substance Use Topics   Alcohol use: No   Drug use: Not Currently    Types: Marijuana    Review of Systems  Constitutional: Negative for fever. Eyes: Negative for visual changes. ENT: Negative for sore throat. Neck: No neck pain  Cardiovascular: Negative for chest pain. Respiratory: Negative for shortness of breath. Gastrointestinal: Negative for abdominal pain, vomiting or diarrhea. + rectal bleeding Genitourinary: Negative for dysuria. Musculoskeletal: Negative for back pain. Skin: Negative for rash. Neurological: Negative for headaches, weakness or numbness. Psych: No SI or HI  ____________________________________________   PHYSICAL EXAM:  VITAL SIGNS: ED Triage Vitals [06/23/21 1725]  Enc Vitals Group     BP (!) 131/97     Pulse Rate (!) 106     Resp 18     Temp 98 F (36.7 C)     Temp src      SpO2 100 %     Weight      Height      Head Circumference      Peak Flow      Pain Score 2     Pain Loc      Pain Edu?      Excl. in GC?     Constitutional: Alert and oriented. Well appearing and in no apparent distress. HEENT:      Head: Normocephalic and atraumatic.  Eyes: Conjunctivae are normal. Sclera is non-icteric.       Mouth/Throat: Mucous membranes are moist.       Neck: Supple with no signs of meningismus. Cardiovascular: Regular rate and rhythm. No murmurs, gallops, or rubs. 2+ symmetrical distal pulses are present in all extremities. No JVD. Respiratory: Normal respiratory effort. Lungs are clear to auscultation bilaterally.  Gastrointestinal: Soft, non tender, and non distended with positive bowel sounds. No rebound or guarding. Genitourinary: No anal fissure, no hemorrhoid, stool brown hemoccult positive Musculoskeletal:  No edema, cyanosis, or erythema of extremities. Neurologic: Normal speech and language. Face is symmetric. Moving all extremities. No gross focal neurologic deficits are  appreciated. Skin: Skin is warm, dry and intact. No rash noted. Psychiatric: Mood and affect are normal. Speech and behavior are normal.  ____________________________________________   LABS (all labs ordered are listed, but only abnormal results are displayed)  Labs Reviewed  CBC WITH DIFFERENTIAL/PLATELET - Abnormal; Notable for the following components:      Result Value   Eosinophils Absolute 0.8 (*)    All other components within normal limits  BASIC METABOLIC PANEL  HEMOGLOBIN AND HEMATOCRIT, BLOOD  TYPE AND SCREEN   ____________________________________________  EKG  none  ____________________________________________  RADIOLOGY  I have personally reviewed the images performed during this visit and I agree with the Radiologist's read.   Interpretation by Radiologist:  CT ANGIO GI BLEED  Result Date: 06/24/2021 CLINICAL DATA:  Gastrointestinal hemorrhage, rectal bleeding, unspecified abdominal pain EXAM: CTA ABDOMEN AND PELVIS WITHOUT AND WITH CONTRAST TECHNIQUE: Multidetector CT imaging of the abdomen and pelvis was performed using the standard protocol during bolus administration of intravenous contrast. Multiplanar reconstructed images and MIPs were obtained and reviewed to evaluate the vascular anatomy. CONTRAST:  OMNIPAQUE IOHEXOL 350 MG/ML SOLN COMPARISON:  None. FINDINGS: VASCULAR Aorta: Normal caliber aorta without aneurysm, dissection, vasculitis or significant stenosis. Celiac: Unremarkable SMA: Unremarkable Renals: Single right and dual left renal arteries are identified. Vessels demonstrate wide patency and normal vascular morphology. No aneurysm. IMA: Unremarkable Inflow: Unremarkable. Internal iliac arteries are patent bilaterally. Proximal Outflow: Unremarkable Veins: Unremarkable. Specifically, the inferior mesenteric vein, superior mesenteric vein, splenic vein, and portal vein are patent. Review of the MIP images confirms the above findings. NON-VASCULAR  Lower chest: No acute abnormality. Hepatobiliary: No focal liver abnormality is seen. No gallstones, gallbladder wall thickening, or biliary dilatation. Pancreas: Unremarkable Spleen: Unremarkable Adrenals/Urinary Tract: Adrenal glands are unremarkable. Kidneys are normal, without renal calculi, focal lesion, or hydronephrosis. Bladder is unremarkable. Stomach/Bowel: The stomach, small bowel, and large bowel are unremarkable. No evidence of obstruction or focal inflammation. No active extravasation identified. Appendix normal. No free intraperitoneal gas or fluid. Lymphatic: No pathologic adenopathy within the abdomen and pelvis. Reproductive: Prostate is unremarkable. Other: No abdominal wall hernia. Musculoskeletal: No acute bone abnormality. IMPRESSION: VASCULAR Normal examination of the abdominal and pelvic vasculature. NON-VASCULAR Normal examination. No evidence of active gastrointestinal hemorrhage on this examination. Electronically Signed   By: Helyn Numbers M.D.   On: 06/24/2021 01:41     ____________________________________________   PROCEDURES  Procedure(s) performed: None Procedures   Critical Care performed:  None ____________________________________________   INITIAL IMPRESSION / ASSESSMENT AND PLAN / ED COURSE  25 y.o. male with a history of asthma who presents for evaluation of bright red blood per rectum x 1.  Patient with 1 episode prior to arrival to the emergency room.  Patient has been here for 8.5 hours with no further episodes.  Patient is hemodynamically  stable.  Initial hemoglobin of 15.3 with a repeat of 14.7 and a rectal exam positive for blood but no melena.  CT angio of the abdomen showing no signs of active bleeding..  Ddx internal hemorrhoid versus diverticular bleed versus gastritis versus peptic ulcer disease versus AVM versus malignancy.    _________________________ 1:54 AM on 06/24/2021 -----------------------------------------  Even though CT does not  show any signs of active bleeding, with positive rectal exam and drop in hemoglobin I recommended admission to the hospital for further monitoring.  Patient reports that he prefers to go home and will return to the emergency room if he has any further episodes of rectal bleeding.  He reports that he feels great, has no abdominal pain and really does not want stay in the hospital.  With a negative CT and him continuing to be hemodynamically stable I believe it is reasonable but we did discuss risks of going home if there is an active bleeding that was not picked up at the CT which include hemorrhagic shock, and death.  Patient understands the risks and continues to want to go home.  Recommended return to the hospital if he changes his mind or any further episodes of rectal bleeding otherwise close follow-up with his primary care doctor.  _____________________________________________ Please note:  Patient was evaluated in Emergency Department today for the symptoms described in the history of present illness. Patient was evaluated in the context of the global COVID-19 pandemic, which necessitated consideration that the patient might be at risk for infection with the SARS-CoV-2 virus that causes COVID-19. Institutional protocols and algorithms that pertain to the evaluation of patients at risk for COVID-19 are in a state of rapid change based on information released by regulatory bodies including the CDC and federal and state organizations. These policies and algorithms were followed during the patient's care in the ED.  Some ED evaluations and interventions may be delayed as a result of limited staffing during the pandemic.   [place] Controlled Substance Database was reviewed by me. ____________________________________________   FINAL CLINICAL IMPRESSION(S) / ED DIAGNOSES   Final diagnoses:  Rectal bleeding      NEW MEDICATIONS STARTED DURING THIS VISIT:  ED Discharge Orders     None         Note:  This document was prepared using Dragon voice recognition software and may include unintentional dictation errors.    Don Perking, [place], MD 06/24/21 [phone]

## 2021-09-07 ENCOUNTER — Emergency Department: Payer: Self-pay

## 2021-09-07 ENCOUNTER — Emergency Department
Admission: EM | Admit: 2021-09-07 | Discharge: 2021-09-07 | Disposition: A | Discharge status: Home / Self Care | Payer: Self-pay | Attending: Emergency Medicine | Admitting: Emergency Medicine

## 2021-09-07 ENCOUNTER — Other Ambulatory Visit: Payer: Self-pay

## 2021-09-07 DIAGNOSIS — M25512 Pain in left shoulder: Secondary | ICD-10-CM | POA: Insufficient documentation

## 2021-09-07 DIAGNOSIS — J45909 Unspecified asthma, uncomplicated: Secondary | ICD-10-CM | POA: Insufficient documentation

## 2021-09-07 LAB — CBC
HCT: 47.9 % (ref 39.0–52.0)
Hemoglobin: 15.8 g/dL (ref 13.0–17.0)
MCH: 28.2 pg (ref 26.0–34.0)
MCHC: 33 g/dL (ref 30.0–36.0)
MCV: 85.5 fL (ref 80.0–100.0)
Platelets: 228 10*3/uL (ref 150–400)
RBC: 5.6 MIL/uL (ref 4.22–5.81)
RDW: 13.7 % (ref 11.5–15.5)
WBC: 8.2 10*3/uL (ref 4.0–10.5)
nRBC: 0 % (ref 0.0–0.2)

## 2021-09-07 LAB — BASIC METABOLIC PANEL
Anion gap: 8 (ref 5–15)
BUN: 8 mg/dL (ref 6–20)
CO2: 26 mmol/L (ref 22–32)
Calcium: 9.2 mg/dL (ref 8.9–10.3)
Chloride: 103 mmol/L (ref 98–111)
Creatinine, Ser: 0.82 mg/dL (ref 0.61–1.24)
GFR, Estimated: >60 mL/min (ref >60)
Glucose, Bld: 82 mg/dL (ref 70–99)
Potassium: 3.9 mmol/L (ref 3.5–5.1)
Sodium: 137 mmol/L (ref 135–145)

## 2021-09-07 LAB — TROPONIN I (HIGH SENSITIVITY)
Troponin I (High Sensitivity): <2 ng/L (ref <18)
Troponin I (High Sensitivity): 2 ng/L (ref <18)

## 2021-09-07 MED ORDER — IBUPROFEN 400 MG PO TABS
400.0000 mg | ORAL_TABLET | Freq: Once | ORAL | Status: AC
  Administered 2021-09-07: 400 mg via ORAL
  Filled 2021-09-07: qty 1

## 2021-09-07 NOTE — ED Triage Notes (Addendum)
Pt arrives with c/o left shoulder pain that radiates down into his arm for about a week. Per pt, pain sometimes radiates to his chest. Pt denies injury.

## 2021-09-07 NOTE — ED Provider Notes (Signed)
[facility]  ____________________________________________   Event Date/Time   First MD Initiated Contact with Patient 09/07/21 2304     (approximate)  I have reviewed the triage vital signs and the nursing notes.   HISTORY  Chief Complaint Shoulder Pain    HPI Greg Nichols is a 25 y.o. male past medical of asthma presents with shoulder pain.  Patient notes that about 5 days ago he abruptly stop smoking marijuana.  Around the same time he stopped he started having pain in the left shoulder.  Pain is located in the left anterior shoulder and near the region of the biceps tendon and occasionally radiates down to the elbow and then down to the left fifth digit.  He denies any numbness or weakness.  He denies chest pain or dyspnea.  Pain sometimes is worse when he is slouched over and worse when he moves the arm in certain positions.  There is no history of similar.  Denies any injury         Past Medical History:  Diagnosis Date   Asthma     There are no problems to display for this patient.   No past surgical history on file.  Prior to Admission medications   Medication Sig Start Date End Date Taking? Authorizing Provider  albuterol (PROVENTIL HFA;VENTOLIN HFA) 108 (90 Base) MCG/ACT inhaler Inhale 2-4 puffs by mouth every 4 hours as needed for wheezing, cough, and/or shortness of breath 01/31/18   Loleta Rose, MD  cyclobenzaprine (FLEXERIL) 10 MG tablet Take 1 tablet (10 mg total) by mouth 3 (three) times daily as needed. 12/05/19   Joni Reining, PA-C  naproxen (NAPROSYN) 500 MG tablet Take 1 tablet (500 mg total) by mouth 2 (two) times daily with a meal. 12/05/19   Joni Reining, PA-C  predniSONE (DELTASONE) 50 MG tablet Take 1 tablet (50 mg total) by mouth daily with breakfast. 12/06/19   Jene Every, MD    Allergies Codeine  No family history on file.  Social History Social History   Tobacco Use   Smoking status: Never   Smokeless  tobacco: Never  Vaping Use   Vaping Use: Some days   Substances: Nicotine  Substance Use Topics   Alcohol use: No   Drug use: Not Currently    Types: Marijuana    Review of Systems   Review of Systems  Constitutional:  Negative for fever.  Respiratory:  Negative for shortness of breath.   Cardiovascular:  Negative for chest pain.  Musculoskeletal:  Positive for arthralgias. Negative for neck pain.  Neurological:  Negative for weakness and numbness.  All other systems reviewed and are negative.  Physical Exam Updated Vital Signs BP 116/87   Pulse 86   Temp 98.3 F (36.8 C) (Oral)   Resp 16   Ht 5\' 11"  (1.803 m)   Wt 102.1 kg   SpO2 98%   BMI 31.38 kg/m   Physical Exam Vitals and nursing note reviewed.  Constitutional:      General: He is not in acute distress.    Appearance: Normal appearance.  HENT:     Head: Normocephalic and atraumatic.  Eyes:     General: No scleral icterus.    Conjunctiva/sclera: Conjunctivae normal.  Pulmonary:     Effort: Pulmonary effort is normal. No respiratory distress.     Breath sounds: Normal breath sounds. No wheezing.  Musculoskeletal:        General: No deformity or signs of injury.  Cervical back: Normal range of motion.     Comments: Mild tenderness palpation of the left anterior shoulder and proximal biceps tendon Normal range of motion with flexion and abduction, no pain with flexion abduction Negative impingement testing 5-5 strength with elbow flexion, extension, grip and finger abduction Incision is grossly intact in the bilateral upper extremities  Skin:    Coloration: Skin is not jaundiced or pale.  Neurological:     General: No focal deficit present.     Mental Status: He is alert and oriented to person, place, and time. Mental status is at baseline.  Psychiatric:        Mood and Affect: Mood normal.        Behavior: Behavior normal.     LABS (all labs ordered are listed, but only abnormal results are  displayed)  Labs Reviewed  CBC  BASIC METABOLIC PANEL  TROPONIN I (HIGH SENSITIVITY)  TROPONIN I (HIGH SENSITIVITY)   ____________________________________________  EKG  Sinus tachycardia, nml axis, nml intervals, no acute ischemic changes  ____________________________________________  RADIOLOGY Ky Barban, personally viewed and evaluated these images (plain radiographs) as part of my medical decision making, as well as reviewing the written report by the radiologist.  ED MD interpretation:  NSR, nml axis, nml intervals, no acute ischemic changes     ____________________________________________   PROCEDURES  Procedure(s) performed (including Critical Care):  Procedures   ____________________________________________   INITIAL IMPRESSION / ASSESSMENT AND PLAN / ED COURSE     25 year old male presenting with left shoulder pain and arm pain over the last 5 days.  Seems to have started when he stopped using marijuana although this is likely unrelated.  His pain is localized over the anterior shoulder and also the biceps tendon.  He has normal range of motion normal strength no midline C-spine tenderness.  Occasionally does have radiation of the pain.  Suspect either muscle strain versus biceps tendinitis.  Cardiac work-up was performed from triage which is reassuring and patient has no shortness of breath or chest pain I have very low suspicion that this is cardiac and his troponins x2 and EKG are reassuring.  We will treat supportively with NSAIDs.  He is stable for discharge.      ____________________________________________   FINAL CLINICAL IMPRESSION(S) / ED DIAGNOSES  Final diagnoses:  Acute pain of left shoulder     ED Discharge Orders     None        Note:  This document was prepared using Dragon voice recognition software and may include unintentional dictation errors.    Georga Hacking, MD 09/07/21 2329

## 2021-09-07 NOTE — Discharge Instructions (Addendum)
Your x-ray, EKG and blood work were all reassuring.  Your pain is likely due to a muscle strain versus tendinitis of your biceps tendon.  You can take NSAIDs including ibuprofen Motrin or Advil for your pain.  Please follow-up with a primary care provider if your pain is not improving.

## 2022-06-11 IMAGING — CR DG SHOULDER 2+V*L*
1 series · 3 of 3 positions shown · non-contrast
Comparison: None.

CLINICAL DATA: Initial evaluation for acute left shoulder pain for
1 week.

EXAM:
LEFT SHOULDER - 2+ VIEW

[Series 1: dg shoulder left · 0.14mm/px · 3 of 3 slices shown]
[im 1/3]
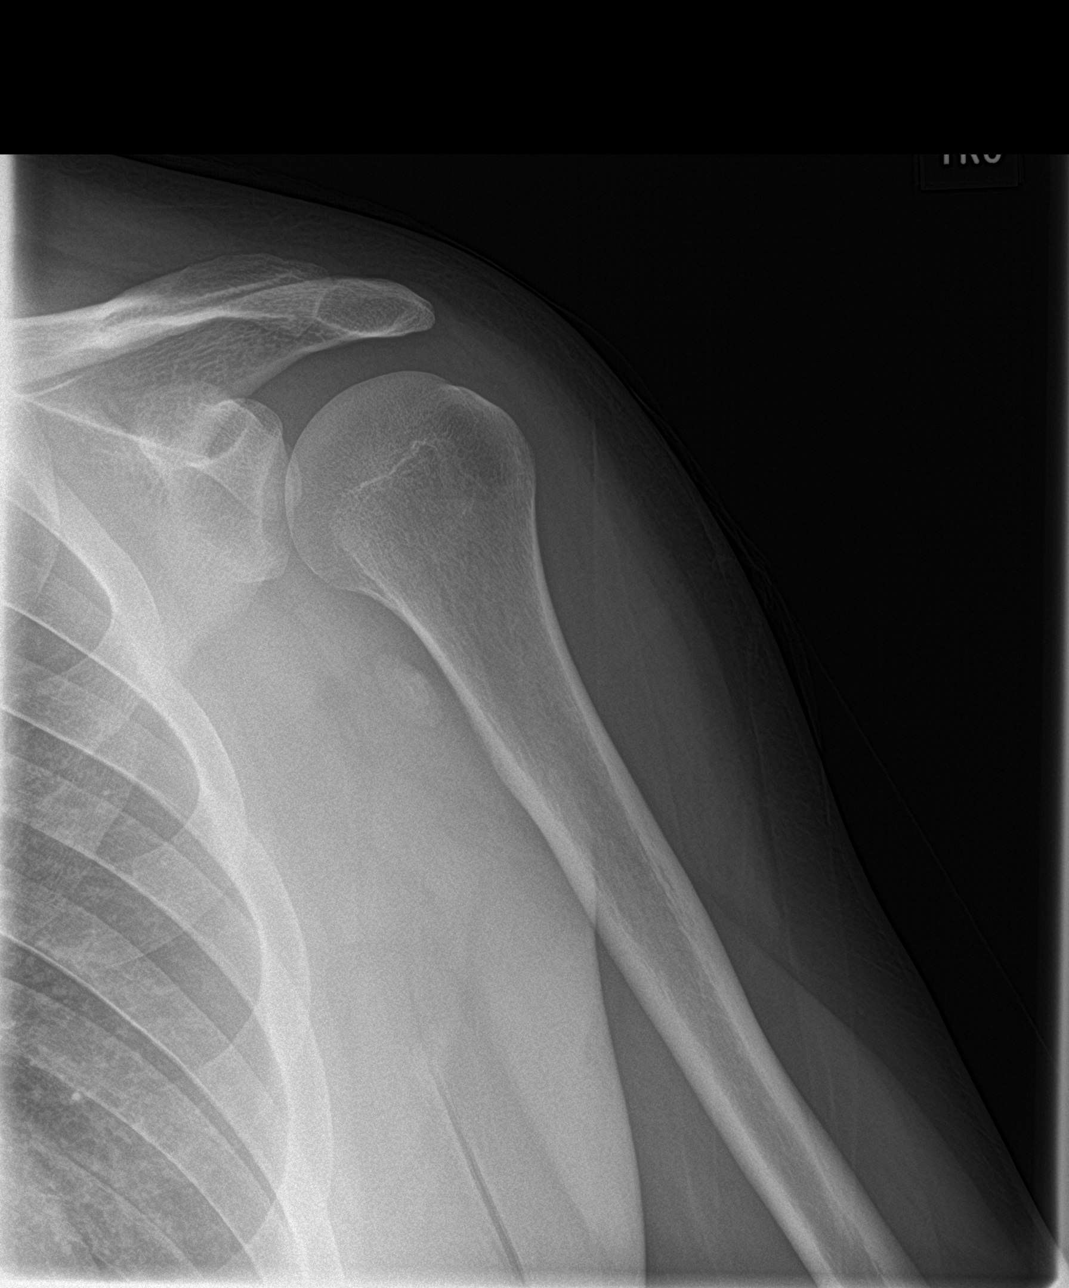
[im 2/3]
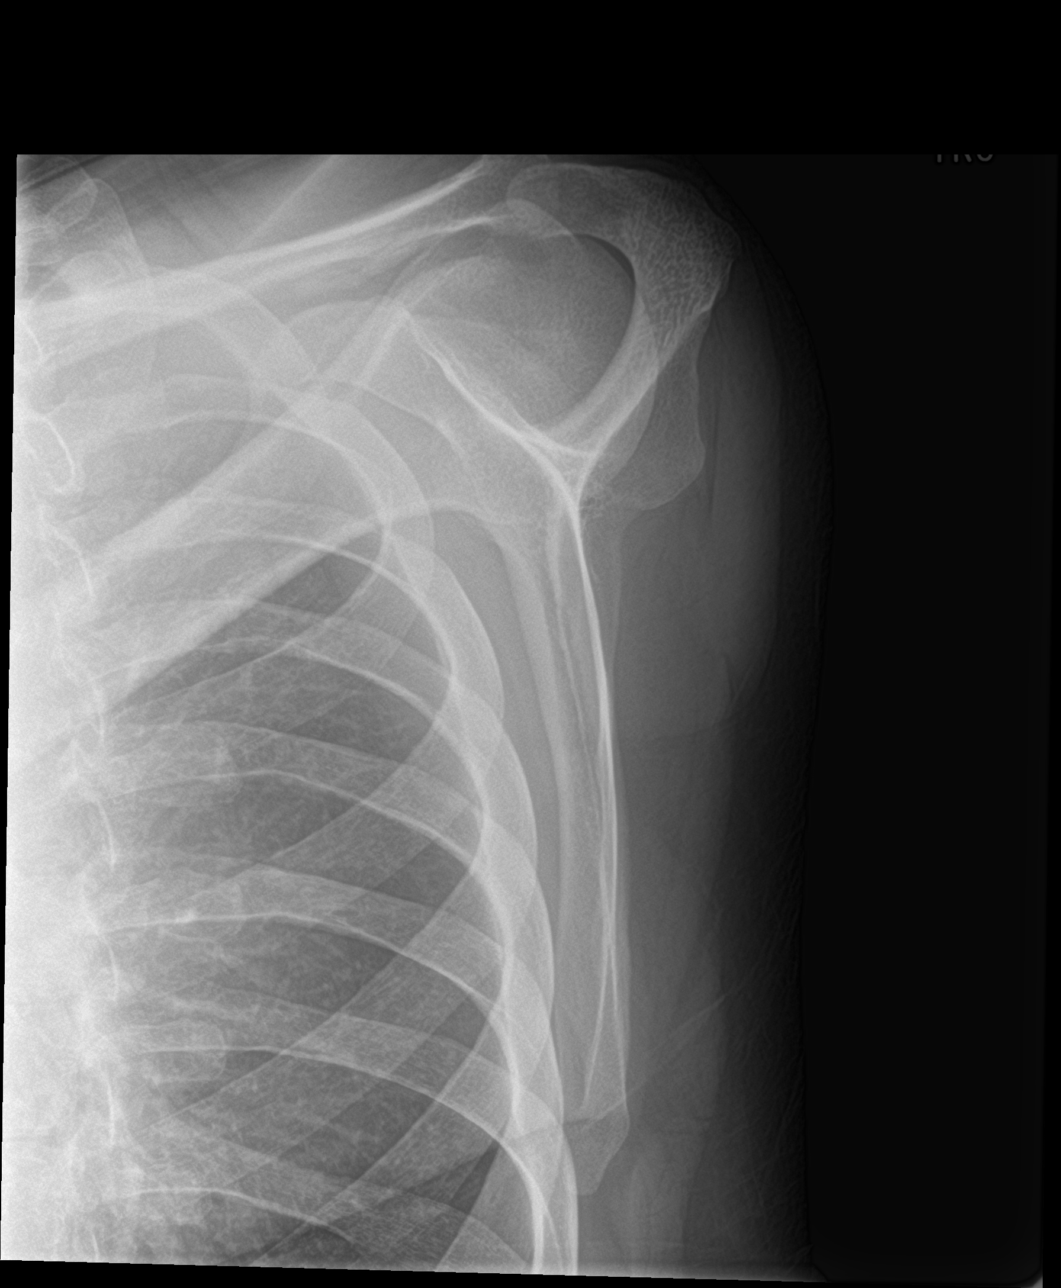
[im 3/3]
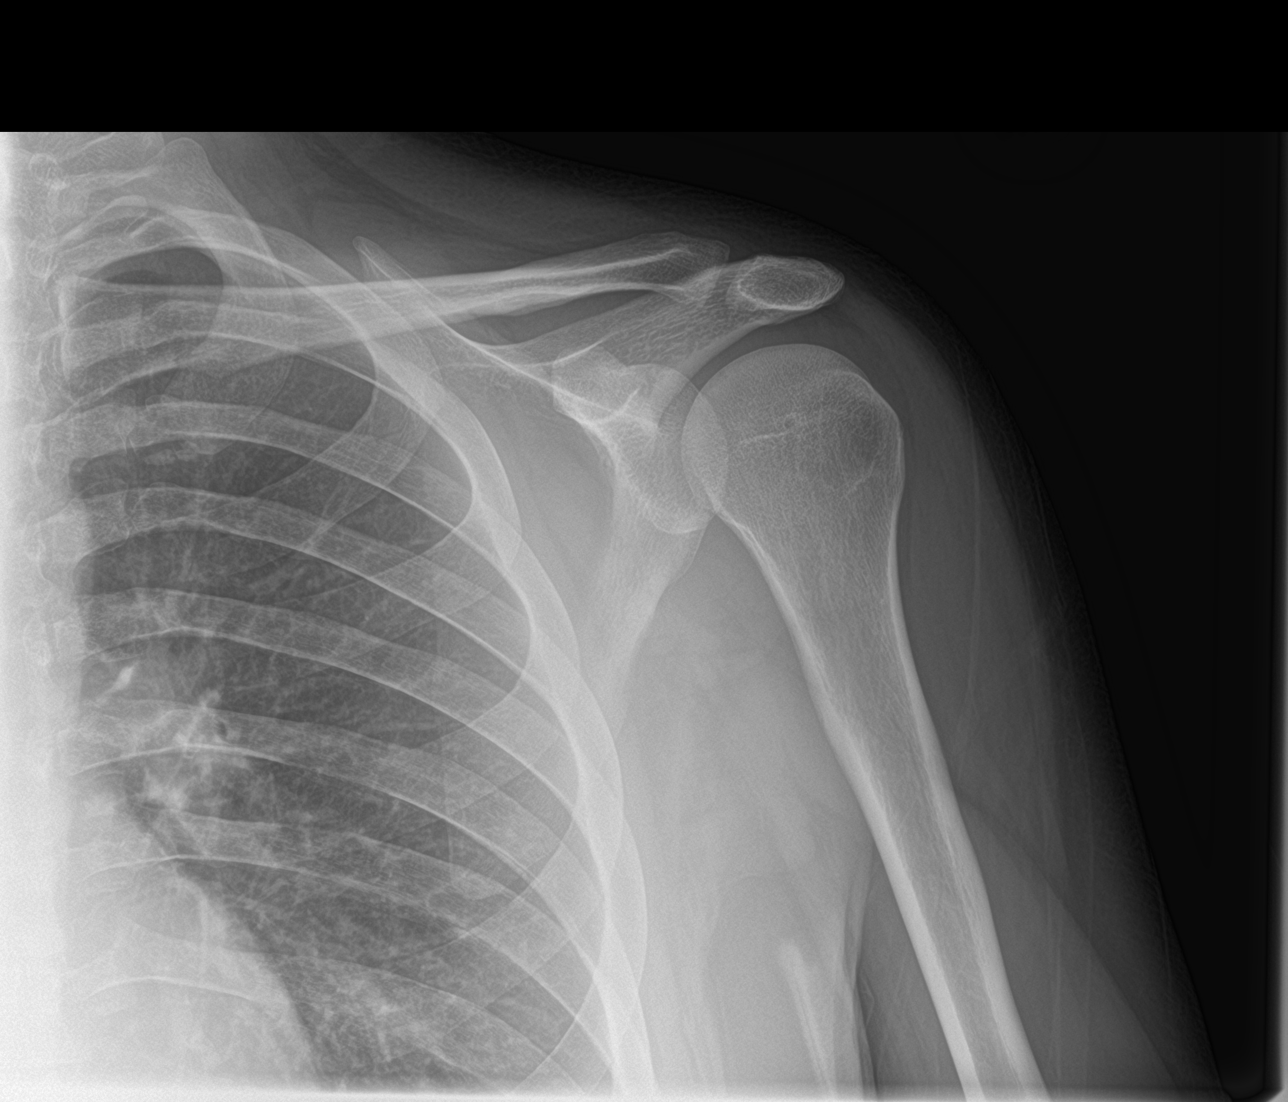

[3 of 3 positions shown; findings below may reference images not displayed]

FINDINGS: No acute fracture or dislocation. Humeral head in normal alignment
with the glenoid. AC joint approximated. Joint spaces well
maintained without evidence for degenerative or erosive arthropathy.
Note made of an ovoid rounded density just inferior to the humeral
head on frontal view, not seen on corresponding views, suspected to
lie external to the patient. No other soft tissue abnormality.
Visualized left hemithorax is clear.
IMPRESSION: Negative radiograph of the left shoulder.  No acute osseous finding.

## 2022-06-11 IMAGING — CR DG CHEST 2V
1 series · 2 of 2 positions shown · non-contrast
Comparison: Radiograph from 12/06/2019.

CLINICAL DATA: Initial evaluation for acute left shoulder pain,
occasional radiation to chest.

EXAM:
CHEST - 2 VIEW

[Series 1: dg chest 2 view · 0.14mm/px · 2 of 2 slices shown]
[im 1/2]
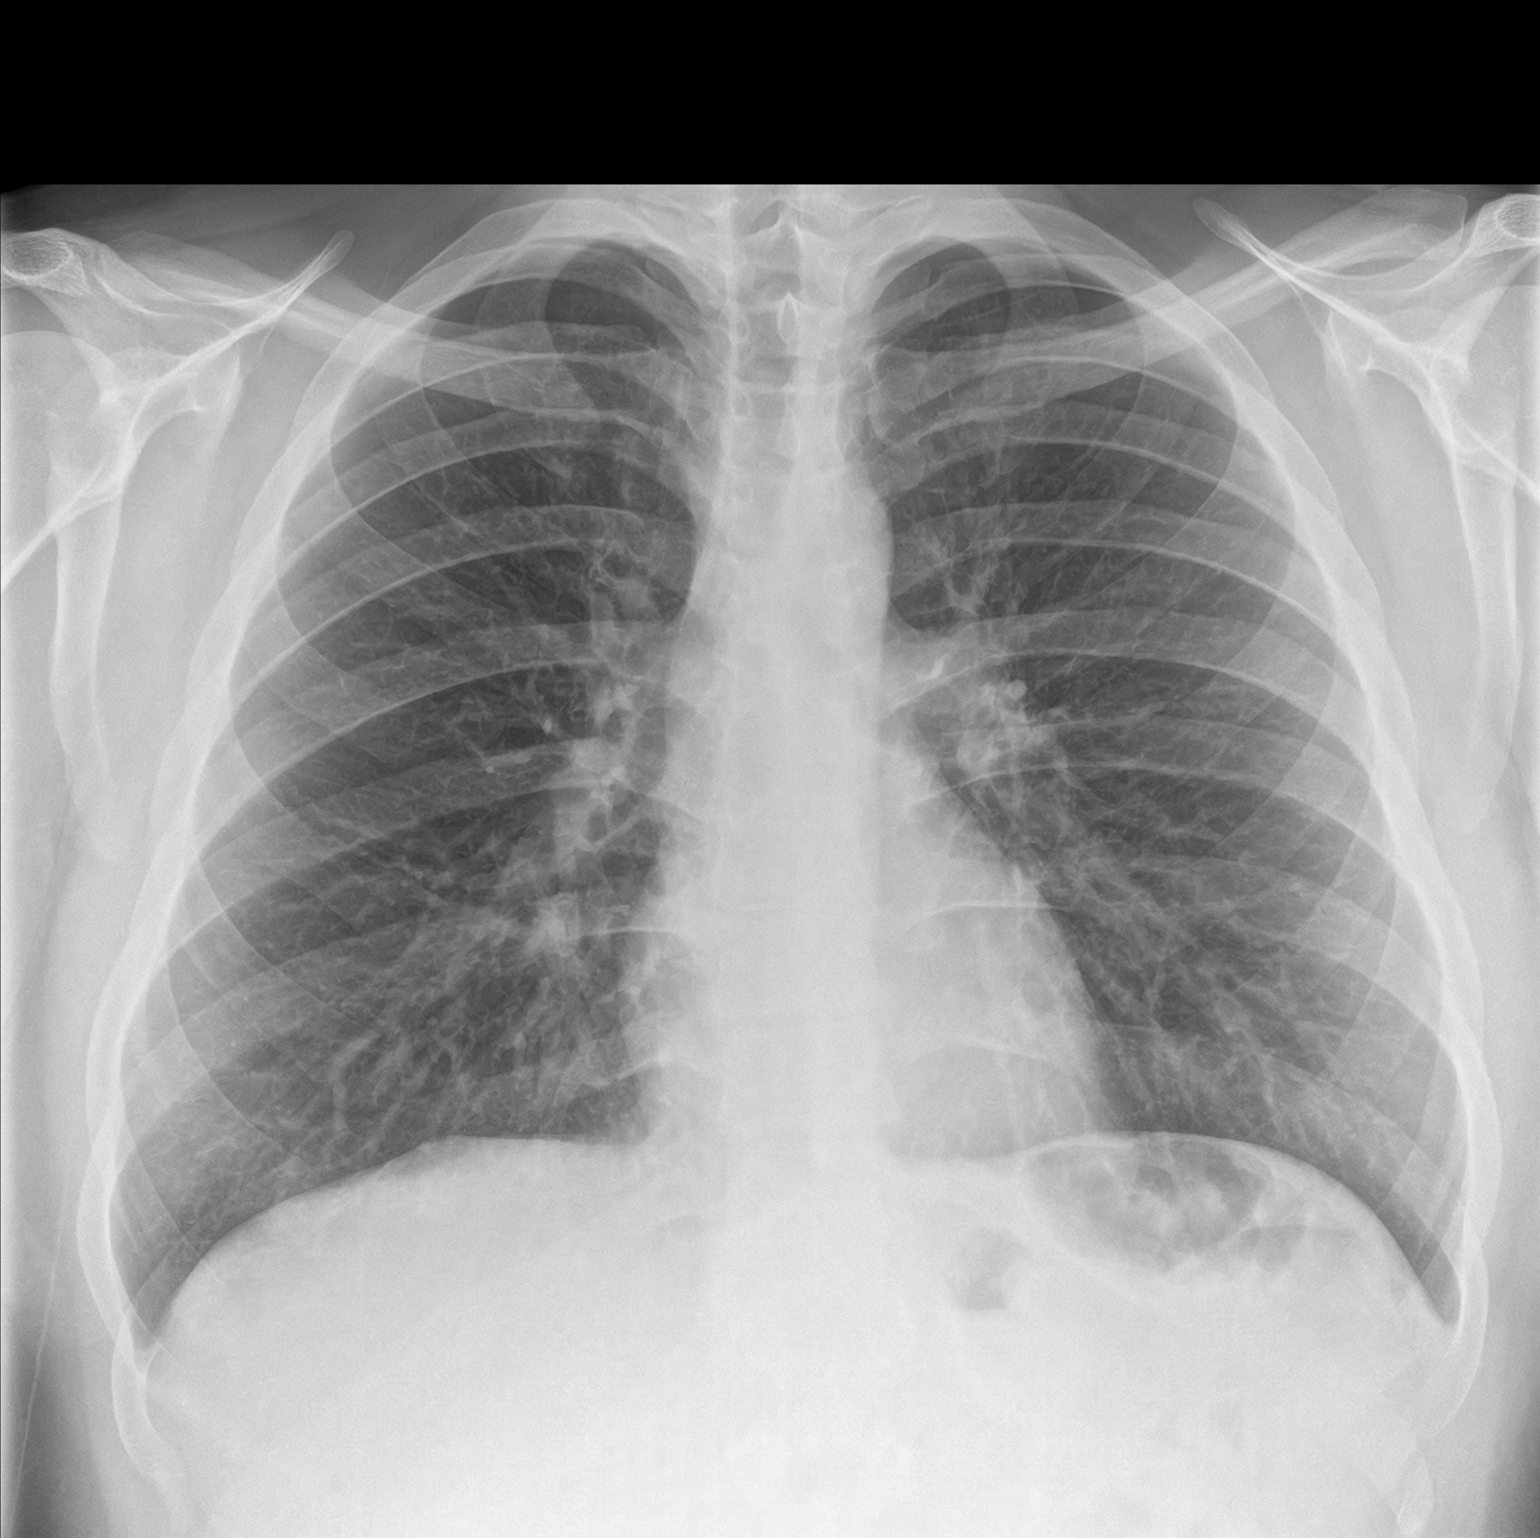
[im 2/2]
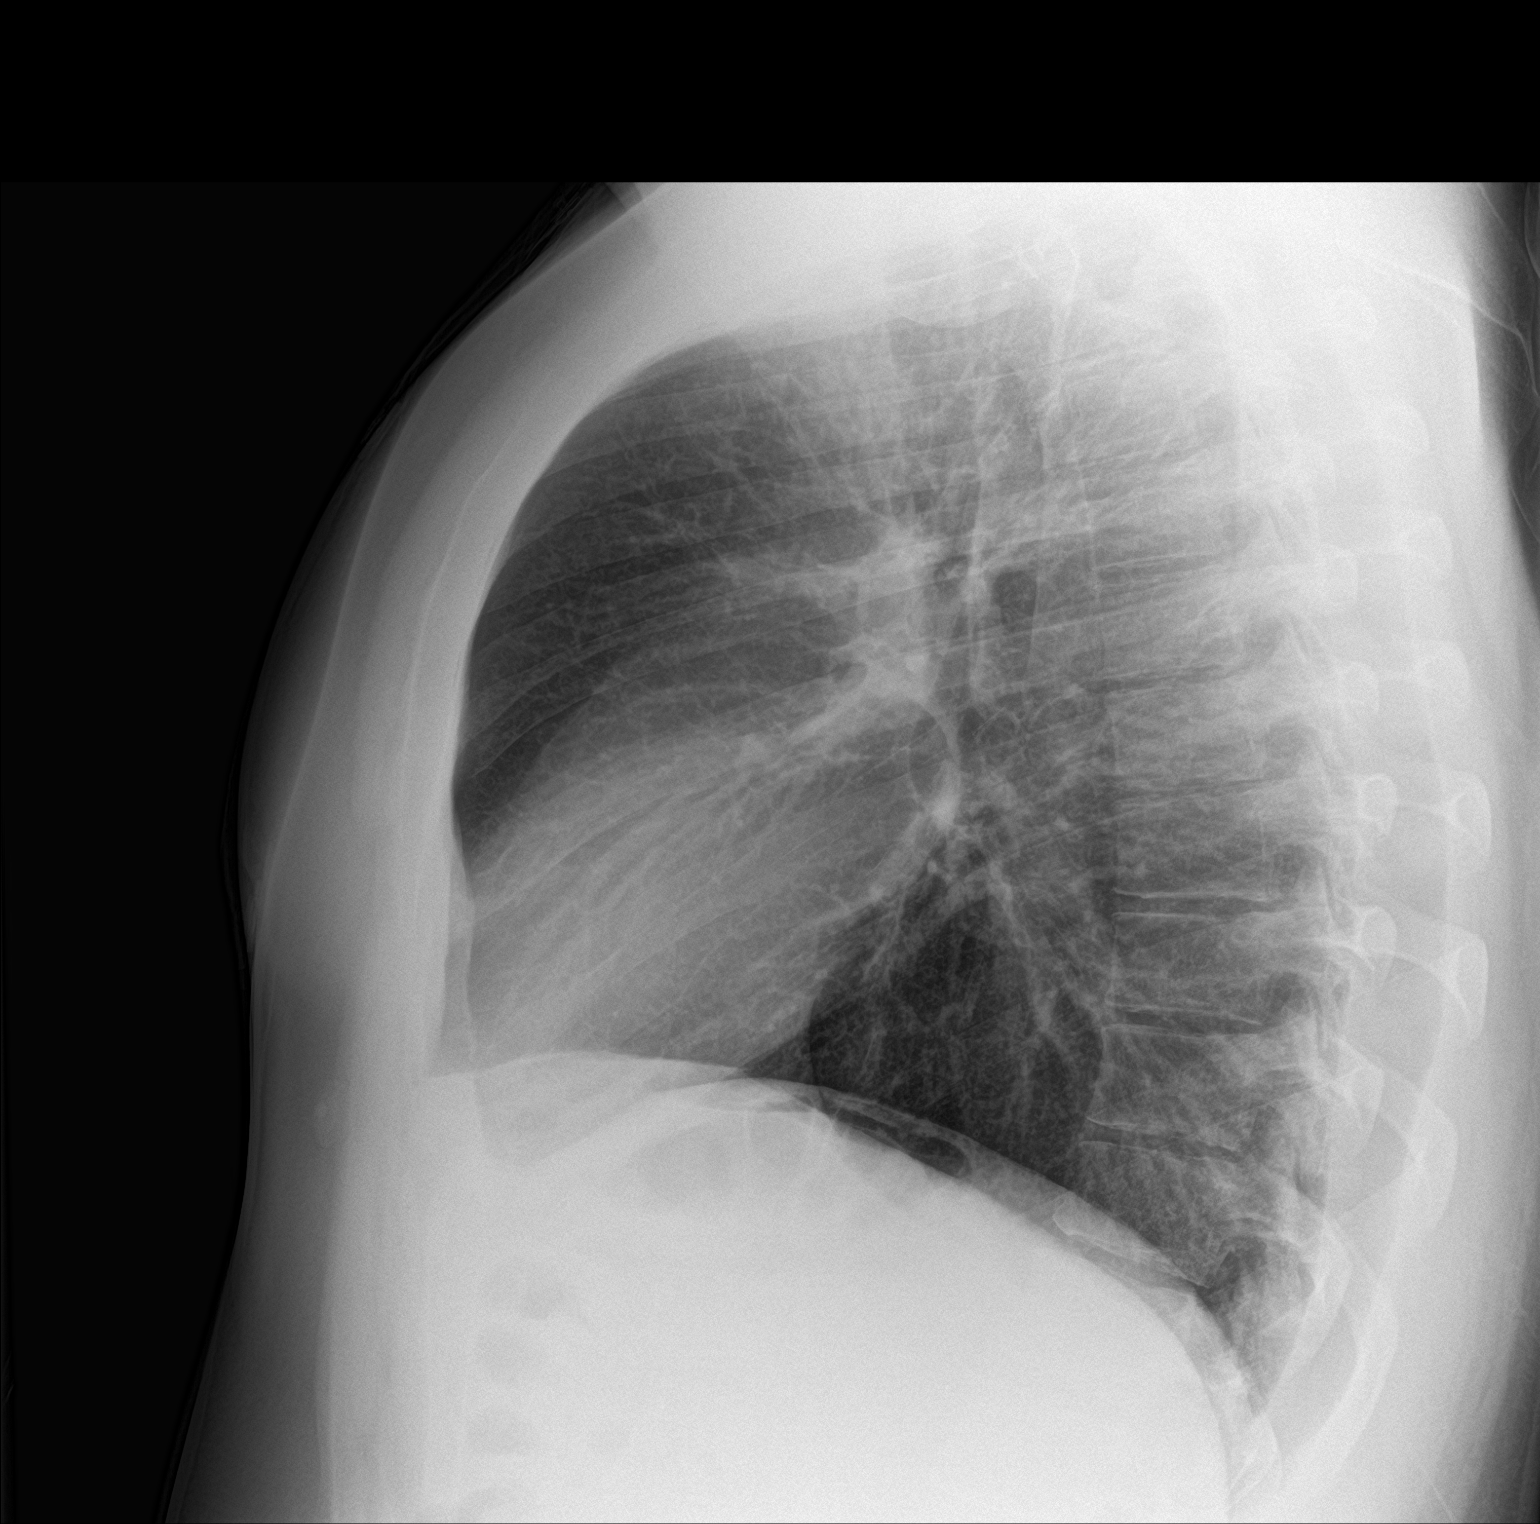

[2 of 2 positions shown; findings below may reference images not displayed]

FINDINGS: The cardiac and mediastinal silhouettes are stable in size and
contour, and remain within normal limits.

The lungs are normally inflated. No airspace consolidation, pleural
effusion, or pulmonary edema. No pneumothorax.

No acute osseous abnormality.
IMPRESSION: No active cardiopulmonary disease.

## 2024-07-07 ENCOUNTER — Other Ambulatory Visit: Payer: Self-pay

## 2024-07-07 ENCOUNTER — Emergency Department
Admission: EM | Admit: 2024-07-07 | Discharge: 2024-07-07 | Disposition: A | Discharge status: Home / Self Care | Payer: Self-pay | Attending: Emergency Medicine | Admitting: Emergency Medicine

## 2024-07-07 ENCOUNTER — Emergency Department: Payer: Self-pay

## 2024-07-07 DIAGNOSIS — J45901 Unspecified asthma with (acute) exacerbation: Secondary | ICD-10-CM | POA: Insufficient documentation

## 2024-07-07 LAB — BASIC METABOLIC PANEL WITH GFR
Anion gap: 10 (ref 5–15)
BUN: 13 mg/dL (ref 6–20)
CO2: 26 mmol/L (ref 22–32)
Calcium: 9.8 mg/dL (ref 8.9–10.3)
Chloride: 104 mmol/L (ref 98–111)
Creatinine, Ser: 1.02 mg/dL (ref 0.61–1.24)
GFR, Estimated: >60 mL/min (ref >60)
Glucose, Bld: 105 mg/dL — ABNORMAL HIGH (ref 70–99)
Potassium: 4 mmol/L (ref 3.5–5.1)
Sodium: 140 mmol/L (ref 135–145)

## 2024-07-07 LAB — CBC
HCT: 46.7 % (ref 39.0–52.0)
Hemoglobin: 15.4 g/dL (ref 13.0–17.0)
MCH: 28.6 pg (ref 26.0–34.0)
MCHC: 33 g/dL (ref 30.0–36.0)
MCV: 86.6 fL (ref 80.0–100.0)
Platelets: 222 K/uL (ref 150–400)
RBC: 5.39 MIL/uL (ref 4.22–5.81)
RDW: 14.5 % (ref 11.5–15.5)
WBC: 6.5 K/uL (ref 4.0–10.5)
nRBC: 0 % (ref 0.0–0.2)

## 2024-07-07 MED ORDER — PREDNISONE 50 MG PO TABS: ORAL_TABLET | ORAL | 0 refills | Status: AC

## 2024-07-07 MED ORDER — ALBUTEROL SULFATE HFA 108 (90 BASE) MCG/ACT IN AERS
INHALATION_SPRAY | RESPIRATORY_TRACT | 1 refills | Status: AC
Start: 2024-07-07 — End: ?

## 2024-07-07 MED ORDER — HYDROXYZINE PAMOATE 100 MG PO CAPS: 100.0000 mg | ORAL_CAPSULE | Freq: Three times a day (TID) | ORAL | 0 refills | Status: AC | PRN

## 2024-07-07 MED ORDER — PREDNISONE 20 MG PO TABS
60.0000 mg | ORAL_TABLET | Freq: Once | ORAL | Status: AC
  Administered 2024-07-07: 60 mg via ORAL
  Filled 2024-07-07: qty 3

## 2024-07-07 MED ORDER — MONTELUKAST SODIUM 10 MG PO TABS: 10.0000 mg | ORAL_TABLET | Freq: Every day | ORAL | 2 refills | Status: AC

## 2024-07-07 MED ORDER — IPRATROPIUM-ALBUTEROL 0.5-2.5 (3) MG/3ML IN SOLN
9.0000 mL | Freq: Once | RESPIRATORY_TRACT | Status: AC
  Administered 2024-07-07: 9 mL via RESPIRATORY_TRACT
  Filled 2024-07-07: qty 9

## 2024-07-07 NOTE — ED Provider Notes (Signed)
 [facility] Provider Note    Event Date/Time   First MD Initiated Contact with Patient 07/07/24 1317     (approximate)   History   Shortness of Breath   HPI  Greg Nichols is a 28 y.o. male with PMH of asthma presents for evaluation of shortness of breath over the past few days.  Patient states that he recently stopped smoking and symptoms began after that.  He is not sure if it is related to his asthma or his being anxious.  He states that his breathing is not as bad as it has been in the past but he does feel like his breathing is restricted.  No fevers or chest pain.  He has been using albuterol  treatments at home with some relief.      Physical Exam   Triage Vital Signs: ED Triage Vitals [07/07/24 1236]  Encounter Vitals Group     BP (!) 135/95     Girls Systolic BP Percentile      Girls Diastolic BP Percentile      Boys Systolic BP Percentile      Boys Diastolic BP Percentile      Pulse Rate 92     Resp 20     Temp 98.2 F (36.8 C)     Temp src      SpO2 100 %     Weight 246 lb (111.6 kg)     Height 5' 10 (1.778 m)     Head Circumference      Peak Flow      Pain Score 0     Pain Loc      Pain Education      Exclude from Growth Chart     Most recent vital signs: Vitals:   07/07/24 1236  BP: (!) 135/95  Pulse: 92  Resp: 20  Temp: 98.2 F (36.8 C)  SpO2: 100%     General: Awake, no distress.  CV:  Good peripheral perfusion. RRR. Resp:  Normal effort. Expiratory wheeze and prolonged expiratory phase with some wheezing in lung fields. Abd:  No distention.  Other:     ED Results / Procedures / Treatments   Labs (all labs ordered are listed, but only abnormal results are displayed) Labs Reviewed  BASIC METABOLIC PANEL WITH GFR - Abnormal; Notable for the following components:      Result Value   Glucose, Bld 105 (*)    All other components within normal limits  CBC     EKG  ED provider interpretation: Normal sinus  rhythm with no ST changes.  Vent. rate 91 BPM PR interval 158 ms QRS duration 78 ms QT/QTcB 342/420 ms P-R-T axes 77 63 46   RADIOLOGY  Chest x-ray obtained, I interpreted the images as well as reviewed the radiologist report, which was negative for any acute cardiopulmonary abnormalities.   PROCEDURES:  Critical Care performed: No  Procedures   MEDICATIONS ORDERED IN ED: Medications  ipratropium-albuterol  (DUONEB) 0.5-2.5 (3) MG/3ML nebulizer solution 9 mL (9 mLs Nebulization Given 07/07/24 1400)  predniSONE  (DELTASONE ) tablet 60 mg (60 mg Oral Given 07/07/24 1400)     IMPRESSION / MDM / ASSESSMENT AND PLAN / ED COURSE  I reviewed the triage vital signs and the nursing notes.                             28 year old male presents for evaluation of shortness of breath.  Blood pressure is a little bit elevated otherwise vital signs are stable.  Patient NAD on exam.  Differential diagnosis includes, but is not limited to, asthma exacerbation, pneumonia, pneumothorax, pulmonary embolism.  Patient's presentation is most consistent with acute complicated illness / injury requiring diagnostic workup.  Will obtain basic labs, chest x-ray and EKG.  Based on exam the patient's presentation is most consistent with an asthma exacerbation.  Will treat with DuoNebs and prednisone  and reassess after medications have been given.   Chest x-ray is negative so do not suspect pneumonia or pneumothorax.  Patient is PERC negative.   Clinical Course as of 07/07/24 1457  Fri Jul 07, 2024  1452 Patient reports an improvement in his symptoms after receiving the medications.  Will plan to prescribe a few days of prednisone .  Will also send a prescription for Singulair and refill patient's inhaler.  Patient also feels that the symptoms may be related to anxiety.  He is already on an SSRI so I offered to prescribe an as needed medication which he was agreeable to.  Patient has follow-up scheduled  with his primary care provider next Friday.  All questions were answered and he was stable at discharge. [LD]  1455 Basic metabolic panel(!) Within normal limits. [LD]  1455 CBC Within normal limits. [LD]    Clinical Course User Index [LD] Cleaster Tinnie LABOR, PA-C     FINAL CLINICAL IMPRESSION(S) / ED DIAGNOSES   Final diagnoses:  Exacerbation of asthma, unspecified asthma severity, unspecified whether persistent     Rx / DC Orders   ED Discharge Orders          Ordered    predniSONE  (DELTASONE ) 50 MG tablet        07/07/24 1455    montelukast (SINGULAIR) 10 MG tablet  Daily at bedtime        07/07/24 1455    hydrOXYzine (VISTARIL) 100 MG capsule  3 times daily PRN        07/07/24 1455    albuterol  (VENTOLIN  HFA) 108 (90 Base) MCG/ACT inhaler        07/07/24 1455             Note:  This document was prepared using Dragon voice recognition software and may include unintentional dictation errors.   Cleaster Tinnie LABOR, PA-C 07/07/24 1457    Viviann Pastor, MD 07/07/24 [phone]

## 2024-07-07 NOTE — Discharge Instructions (Addendum)
 Your EKG, chest x-ray and blood work were all normal today.  I believe your symptoms are the result of asthma and some anxiety.  I have sent some steroid pills for you to take over the next few days.  I also sent an allergy medication for you to take at bedtime.  This is to help prevent triggers to your asthma.  You can also take a nondrowsy allergy medication like Allegra, Zyrtec or Claritin.  I sent a refill of the albuterol  inhaler.  I also sent in a medication called hydroxyzine, this can be taken 3 times daily as needed for anxiety.  Please follow-up with your primary care provider next week to discuss further management of your asthma and anxiety.  Return to the emergency department with any worsening symptoms.

## 2024-07-07 NOTE — ED Notes (Signed)
 Pt provided discharge instructions and prescription information. Pt was given the opportunity to ask questions and questions were answered.

## 2024-07-07 NOTE — ED Triage Notes (Signed)
 Pt to ED for shob for a past few days. Hx anxiety and asthma. Has been using inhaler with minimal relief. Pt speaking in complete sentences, NAD noted.
